# Patient Record
Sex: Female | Born: 1980
Health system: Southern US, Community
[De-identification: ages and names within clinical notes are randomized; demographics above are authoritative.]

## PROBLEM LIST (undated history)

## (undated) DIAGNOSIS — E538 Deficiency of other specified B group vitamins: Secondary | ICD-10-CM

## (undated) DIAGNOSIS — Z87442 Personal history of urinary calculi: Secondary | ICD-10-CM

## (undated) DIAGNOSIS — E559 Vitamin D deficiency, unspecified: Secondary | ICD-10-CM

## (undated) DIAGNOSIS — F419 Anxiety disorder, unspecified: Secondary | ICD-10-CM

## (undated) DIAGNOSIS — I10 Essential (primary) hypertension: Secondary | ICD-10-CM

## (undated) DIAGNOSIS — Z8619 Personal history of other infectious and parasitic diseases: Secondary | ICD-10-CM

## (undated) DIAGNOSIS — G43909 Migraine, unspecified, not intractable, without status migrainosus: Secondary | ICD-10-CM

## (undated) DIAGNOSIS — N200 Calculus of kidney: Secondary | ICD-10-CM

## (undated) HISTORY — DX: Vitamin D deficiency, unspecified: E55.9

## (undated) HISTORY — DX: Personal history of other infectious and parasitic diseases: Z86.19

## (undated) HISTORY — DX: Calculus of kidney: N20.0

## (undated) HISTORY — PX: WISDOM TOOTH EXTRACTION: SHX21

## (undated) HISTORY — DX: Anxiety disorder, unspecified: F41.9

## (undated) HISTORY — DX: Essential (primary) hypertension: I10

## (undated) HISTORY — DX: Deficiency of other specified B group vitamins: E53.8

## (undated) HISTORY — DX: Migraine, unspecified, not intractable, without status migrainosus: G43.909

---

## 2006-09-28 ENCOUNTER — Inpatient Hospital Stay (HOSPITAL_COMMUNITY): Admission: AD | Admit: 2006-09-28 | Discharge: 2006-10-01 | Payer: Self-pay | Admitting: Obstetrics and Gynecology

## 2008-01-02 ENCOUNTER — Encounter (INDEPENDENT_AMBULATORY_CARE_PROVIDER_SITE_OTHER): Payer: Self-pay | Admitting: *Deleted

## 2008-01-08 ENCOUNTER — Ambulatory Visit: Payer: Self-pay | Admitting: Internal Medicine

## 2008-01-08 DIAGNOSIS — I10 Essential (primary) hypertension: Secondary | ICD-10-CM | POA: Insufficient documentation

## 2008-01-16 ENCOUNTER — Encounter (INDEPENDENT_AMBULATORY_CARE_PROVIDER_SITE_OTHER): Payer: Self-pay | Admitting: *Deleted

## 2008-01-16 LAB — CONVERTED CEMR LAB
BUN: 14 mg/dL (ref 6–23)
Basophils Absolute: 0.2 10*3/uL — ABNORMAL HIGH (ref 0.0–0.1)
Basophils Relative: 3.5 % — ABNORMAL HIGH (ref 0.0–3.0)
Calcium: 9 mg/dL (ref 8.4–10.5)
Creatinine, Ser: 1 mg/dL (ref 0.4–1.2)
Eosinophils Absolute: 0.1 10*3/uL (ref 0.0–0.7)
Eosinophils Relative: 2 % (ref 0.0–5.0)
GFR calc non Af Amer: 71 mL/min
HCT: 40.9 % (ref 36.0–46.0)
Hemoglobin: 14.1 g/dL (ref 12.0–15.0)
MCHC: 34.4 g/dL (ref 30.0–36.0)
MCV: 90 fL (ref 78.0–100.0)
Monocytes Absolute: 0.4 10*3/uL (ref 0.1–1.0)
Neutro Abs: 3.5 10*3/uL (ref 1.4–7.7)
RBC: 4.54 M/uL (ref 3.87–5.11)
TSH: 1.98 microintl units/mL (ref 0.35–5.50)

## 2008-01-30 ENCOUNTER — Telehealth: Payer: Self-pay | Admitting: Internal Medicine

## 2008-10-14 ENCOUNTER — Observation Stay (HOSPITAL_COMMUNITY): Admission: AD | Admit: 2008-10-14 | Discharge: 2008-10-14 | Payer: Self-pay | Admitting: Obstetrics and Gynecology

## 2008-10-22 ENCOUNTER — Inpatient Hospital Stay (HOSPITAL_COMMUNITY): Admission: AD | Admit: 2008-10-22 | Discharge: 2008-10-24 | Payer: Self-pay | Admitting: Obstetrics & Gynecology

## 2009-03-11 ENCOUNTER — Ambulatory Visit: Payer: Self-pay | Admitting: Internal Medicine

## 2009-03-11 DIAGNOSIS — G43909 Migraine, unspecified, not intractable, without status migrainosus: Secondary | ICD-10-CM | POA: Insufficient documentation

## 2009-04-03 ENCOUNTER — Telehealth (INDEPENDENT_AMBULATORY_CARE_PROVIDER_SITE_OTHER): Payer: Self-pay | Admitting: *Deleted

## 2009-08-06 ENCOUNTER — Telehealth (INDEPENDENT_AMBULATORY_CARE_PROVIDER_SITE_OTHER): Payer: Self-pay | Admitting: *Deleted

## 2009-09-08 ENCOUNTER — Ambulatory Visit: Payer: Self-pay | Admitting: Internal Medicine

## 2009-09-08 DIAGNOSIS — F411 Generalized anxiety disorder: Secondary | ICD-10-CM | POA: Insufficient documentation

## 2009-10-13 ENCOUNTER — Ambulatory Visit: Payer: Self-pay | Admitting: Internal Medicine

## 2009-12-18 ENCOUNTER — Telehealth: Payer: Self-pay | Admitting: Internal Medicine

## 2010-04-07 ENCOUNTER — Ambulatory Visit
Admission: RE | Admit: 2010-04-07 | Discharge: 2010-04-07 | Payer: Self-pay | Source: Home / Self Care | Attending: Internal Medicine | Admitting: Internal Medicine

## 2010-05-05 NOTE — Assessment & Plan Note (Signed)
Summary: 6mos followup/alr   Vital Signs:  Patient profile:   30 year old female Height:      66 inches Weight:      120 pounds Temp:     98.4 degrees F oral Pulse rate:   72 / minute BP sitting:   100 / 78  (left arm)  Vitals Entered By: Jeremy Johann CMA (September 08, 2009 9:56 AM) CC: 6 month  Comments --BP check -- discuss anxiety issues REVIEWED MED LIST, PATIENT AGREED DOSE AND INSTRUCTION CORRECT    History of Present Illness: routine followup Doing well Ambulatory BP is around 120/80   Allergies (verified): No Known Drug Allergies  Past History:  Past Medical History: G2P2 Hypertension Dx as at age 55 kidney stones h/o migraines  Anxiety  Review of Systems       denies fatigue other  than not sleeping well due to her babies waking up  she's under a lot of stress, no anxiety per se but has noticed some diarrhea in the mornings, occasional nausea, no vomiting, no blood in the stools. Mild headaches different from her migraines. Thinks that all these symptoms are related to anxiety Her jaw is tight at night at some point she took Zoloft and wondersIf she should go back to it. She is still nursing  Physical Exam  General:  alert, well-developed, and well-nourished.   Lungs:  normal respiratory effort, no intercostal retractions, no accessory muscle use, and normal breath sounds.   Heart:  normal rate, regular rhythm, and no murmur.   Extremities:  no edema   Impression & Recommendations:  Problem # 1:  HYPERTENSION (ICD-401.9) well-controlled, no change Her updated medication list for this problem includes:    Labetalol Hcl 100 Mg Tabs (Labetalol hcl) .Marland Kitchen... 1 by mouth two times a day  Problem # 2:  HEADACHE (ICD-784.0) having mild HAs,  different from her regular migraine, patient thinks related to anxiety Will let me know if the HAs get  worse Her updated medication list for this problem includes:    Labetalol Hcl 100 Mg Tabs (Labetalol hcl) .Marland Kitchen... 1  by mouth two times a day  Problem # 3:  ANXIETY (ICD-300.00) see review of systems, patient having some anxiety lately. she would like to retry sertraline that helped in the past. I think that is reasonable, a prescription is issued. This is a safe drug during breast-feeding. Come back in 4 weeks If the mild headache some nausea continued, we'll have to workup those symptoms Her updated medication list for this problem includes:    Sertraline Hcl 50 Mg Tabs (Sertraline hcl) .Marland Kitchen... 1/2 a day x 1 week, then 1 a day  Complete Medication List: 1)  Labetalol Hcl 100 Mg Tabs (Labetalol hcl) .Marland Kitchen.. 1 by mouth two times a day 2)  Camila 0.35 Mg Tabs (Norethindrone) .... As directed 3)  Sertraline Hcl 50 Mg Tabs (Sertraline hcl) .... 1/2 a day x 1 week, then 1 a day  Patient Instructions: 1)  Please schedule a follow-up appointment in 4  weeks.  Prescriptions: LABETALOL HCL 100 MG TABS (LABETALOL HCL) 1 by mouth two times a day  #180 x 1   Entered and Authorized by:   Elita Quick E. Jourdin Gens MD   Signed by:   Nolon Rod. Rhett Mutschler MD on 09/08/2009   Method used:   Print then Give to Patient   RxID:   [id number] SERTRALINE HCL 50 MG TABS (SERTRALINE HCL) 1/2 a day x 1 week, then  1 a day  #30 x 3   Entered and Authorized by:   Nolon Rod. Rollyn Scialdone MD   Signed by:   Nolon Rod. Kaleb Linquist MD on 09/08/2009   Method used:   Print then Give to Patient   RxID:   [id number]

## 2010-05-05 NOTE — Progress Notes (Signed)
Summary: Jaw Clenching   Phone Note Call from Patient Call back at Home Phone [phone]   Caller: Patient Summary of Call: Patient called in and said that now that she has weened her daughter from nursing she was wanting to try and take something to calm her at night because of the jaw clenching. She would like to try this first before spending $500 on a mouth gaurd from the dentist. Please advise.    [facility] [place] [facility] [address] Initial call taken by: Harold Barban,  December 18, 2009 9:14 AM  Follow-up for Phone Call        we could try clonazepam 0.5 mg one or 2 p.o. q.h.s. call  #40, no refills. should help her relax at night. make her  aware that she cannot take this if she gets pregnant again Follow-up by: Jose E. Paz MD,  December 18, 2009 1:17 PM  Additional Follow-up for Phone Call Additional follow up Details #1::        Left messgae for pt to call back. Army Fossa CMA  December 18, 2009 1:26 PM     Additional Follow-up for Phone Call Additional follow up Details #2::    Discussed with pt she is aware of directions. Army Fossa CMA  December 18, 2009 1:43 PM   New/Updated Medications: CLONAZEPAM 0.5 MG TABS (CLONAZEPAM) 1 or 2 p.o. q.h.s. Prescriptions: CLONAZEPAM 0.5 MG TABS (CLONAZEPAM) 1 or 2 p.o. q.h.s.  #40 x 0   Entered by:   Army Fossa CMA   Authorized by:   Nolon Rod. Paz MD   Signed by:   Army Fossa CMA on 12/18/2009   Method used:   Printed then faxed to ...       [facility]  [address] [phone][facility] ([facility]ail[facility]       [address]       [place], [place]  [address]       Ph: [phone] or [phone]       Fax: [phone]   RxID:   [id number]

## 2010-05-05 NOTE — Progress Notes (Signed)
Summary: Refill Information  Phone Note Call from Patient   Caller: Patient Reason for Call: Refill Medication Details for Reason: Labatelol Summary of Call: Patient called this morning and said she needs this medicine, this is day 2 without it. I told her we had sent it in on 5.2.11 but it seems the pharmacy never got it. Initial call taken by: Harold Barban,  Aug 06, 2009 9:16 AM  Follow-up for Phone Call        Left msg for pt rx is ready for pickup, rx was resent this am and pharmacy has it .Kandice Hams  Aug 06, 2009 9:24 AM  Follow-up by: Kandice Hams,  Aug 06, 2009 9:24 AM

## 2010-05-05 NOTE — Assessment & Plan Note (Signed)
Summary: 4 week roa//lch   Vital Signs:  Patient profile:   30 year old female Weight:      118.25 pounds Pulse rate:   75 / minute Pulse rhythm:   regular BP sitting:   118 / 70  (left arm) Cuff size:   regular  Vitals Entered By: Army Fossa CMA (October 13, 2009 10:30 AM) CC: Pt here to f/u on Sertraline- working well.   History of Present Illness: she was seen last month with anxiety started sertraline Overall feels better and tolerating the medication well  Allergies (verified): No Known Drug Allergies  Past History:  Past Medical History: Reviewed history from 09/08/2009 and no changes required. G2P2 Hypertension Dx as at age 74 kidney stones h/o migraines  Anxiety  Past Surgical History: Reviewed history from 01/08/2008 and no changes required. no  Social History: Married 2 child  Never Smoked Alcohol use-no Drug use-no exercise-- sometimes, active   Review of Systems       previously she had nausea, some diarrhea and headache. All that is better. her jaw  was very tight and that has not improved significantly She is still breast-feeding She rarely takes ambulatory BPs but they are usually okay  Physical Exam  General:  alert, well-developed, and well-nourished.   Head:  no TMJ click Lungs:  normal respiratory effort, no intercostal retractions, no accessory muscle use, and normal breath sounds.   Heart:  normal rate, regular rhythm, and no murmur.   Psych:  Oriented X3, memory intact for recent and remote, normally interactive, good eye contact, not anxious appearing, and not depressed appearing.     Impression & Recommendations:  Problem # 1:  ANXIETY (ICD-300.00) seems to be improving Continue with same meds He still has "jaw tightness", symptoms are worse at night. Recommend to see her dentist: She may need a mouth guard Her updated medication list for this problem includes:    Sertraline Hcl 50 Mg Tabs (Sertraline hcl) .Marland Kitchen... 1 a  day  Problem # 2:  HYPERTENSION (ICD-401.9) well controlled. No change Her updated medication list for this problem includes:    Labetalol Hcl 100 Mg Tabs (Labetalol hcl) .Marland Kitchen... 1 by mouth two times a day  BP today: 118/70 Prior BP: 100/78 (09/08/2009)  Labs Reviewed: K+: 4.0 (01/08/2008) Creat: : 1.0 (01/08/2008)     Complete Medication List: 1)  Labetalol Hcl 100 Mg Tabs (Labetalol hcl) .Marland Kitchen.. 1 by mouth two times a day 2)  Camila 0.35 Mg Tabs (Norethindrone) .... As directed 3)  Sertraline Hcl 50 Mg Tabs (Sertraline hcl) .Marland Kitchen.. 1 a day  Patient Instructions: 1)  Please schedule a follow-up appointment in 6 months .  Prescriptions: SERTRALINE HCL 50 MG TABS (SERTRALINE HCL) 1 a day  #90 x 1   Entered and Authorized by:   Elita Quick E. Lakiesha Ralphs MD   Signed by:   Nolon Rod. Brooklin Rieger MD on 10/13/2009   Method used:   Print then Give to Patient   RxID:   [id number]

## 2010-05-07 NOTE — Assessment & Plan Note (Signed)
Summary: 6 MONTH FOLLOWUP/KN   Vital Signs:  Patient profile:   30 year old female Height:      66 inches Weight:      124.25 pounds BMI:     20.13 Pulse rate:   72 / minute Pulse rhythm:   regular BP sitting:   122 / 70  (left arm) Cuff size:   regular  Vitals Entered By: Army Fossa CMA (April 07, 2010 9:54 AM) CC: 6 month f/u- had coffee this am Comments c/o migraines- thinks it may be releated to [facility] [address].    History of Present Illness: ROV here w/ her 2 children c/o increased migraines compared to previous months HA are similar to previos migraines , usually unilateral, if severe they're associated with nausea and vomiting. Excedrin over-the-counter help  to some extent but has  caffeine which makes it harder to use at night time  ROS  anxiety well controlled w/ Sertraline although recognizes that the days that she feels more stressed, she has more headaches Ambulatory blood pressures usually within normal She switched her birth control pills, she medications list She is not nursing at this point. Her periods are back, she thinks that is actually increasing her headaches     Current Medications (verified): 1)  Labetalol Hcl 100 Mg Tabs (Labetalol Hcl) .Marland Kitchen.. 1 By Mouth Two Times A Day 2)  Camila 0.35 Mg Tabs (Norethindrone) .... As Directed 3)  Sertraline Hcl 50 Mg Tabs (Sertraline Hcl) .Marland Kitchen.. 1 A Day  Allergies (verified): No Known Drug Allergies  Past History:  Past Medical History: Reviewed history from 09/08/2009 and no changes required. G2P2 Hypertension Dx as at age 67 kidney stones h/o migraines  Anxiety  Past Surgical History: Reviewed history from 01/08/2008 and no changes required. no  Social History: Reviewed history from 10/13/2009 and no changes required. Married 2 child  Never Smoked Alcohol use-no Drug use-no exercise-- sometimes, active   Physical Exam  General:  alert, well-developed, and well-nourished.     Neurologic:  strength normal in all extremities and gait normal.   Psych:  normally interactive, good eye contact, and not depressed appearing.  slightly anxious, very  busy taking care of her 2 children   Impression & Recommendations:  Problem # 1:  HEADACHE (ICD-784.0) increase episodes of her headaches. The features of the headache is similar to previous migraines. Plan: Continue labetalol Increase sertraline dose as one of the triggers for her headaches his anxiety Continue over-the-counter Excedrin Trial of Maxalt, samples and a prescription provided  Her updated medication list for this problem includes:    Labetalol Hcl 100 Mg Tabs (Labetalol hcl) .Marland Kitchen... 1 by mouth two times a day    Maxalt 10 Mg Tabs (Rizatriptan benzoate) .Marland Kitchen... 1 by mouth with onset of headache, repeat x 1 two hours later if needed. no more than 2 a day  Problem # 2:  ANXIETY (ICD-300.00) well-controlled but d/t the increase in headaches will increase the dose of sertraline The following medications were removed from the medication list:    Clonazepam 0.5 Mg Tabs (Clonazepam) .Marland Kitchen... 1 or 2 p.o. q.h.s. Her updated medication list for this problem includes:    Sertraline Hcl 50 Mg Tabs (Sertraline hcl) .Marland Kitchen... 1.5 tabs a day  Problem # 3:  HYPERTENSION (ICD-401.9) at goal  Her updated medication list for this problem includes:    Labetalol Hcl 100 Mg Tabs (Labetalol hcl) .Marland Kitchen... 1 by mouth two times a day  Complete Medication List:  1)  Labetalol Hcl 100 Mg Tabs (Labetalol hcl) .Marland Kitchen.. 1 by mouth two times a day 2)  Camila 0.35 Mg Tabs (Norethindrone) .... As directed 3)  Sertraline Hcl 50 Mg Tabs (Sertraline hcl) .... 1.5 tabs a day 4)  Maxalt 10 Mg Tabs (Rizatriptan benzoate) .Marland Kitchen.. 1 by mouth with onset of headache, repeat x 1 two hours later if needed. no more than 2 a day  Patient Instructions: 1)  increase sertraline to 1.5 tabs a day 2)  as soon as you have A headache 3)  rest-fluids 4)  use maxalt 10mg  1  tab and may repeat in 2 hours, no more than 2 tabs a day 5)  you may continue taking excedrin instead of maxalt  6)  calll if no better  7)  Please schedule a follow-up appointment in 6 months .  Prescriptions: SERTRALINE HCL 50 MG TABS (SERTRALINE HCL) 1.5 tabs a day  #60 x 6   Entered and Authorized by:   Elita Quick E. Norton Bivins MD   Signed by:   Nolon Rod. Dessie Tatem MD on 04/07/2010   Method used:   Print then Give to Patient   RxID:   [id number] MAXALT 10 MG TABS (RIZATRIPTAN BENZOATE) 1 by mouth with onset of headache, repeat x 1 two hours later if needed. No more than 2 a day  #12 x 0   Entered and Authorized by:   Nolon Rod. Liam Cammarata MD   Signed by:   Nolon Rod. Chalee Hirota MD on 04/07/2010   Method used:   Print then Give to Patient   RxID:   [id number]    Orders Added: 1)  Est. Patient Level III [84696]

## 2010-07-12 LAB — COMPREHENSIVE METABOLIC PANEL WITH GFR
ALT: 17 U/L (ref 0–35)
ALT: 13 U/L (ref 0–35)
AST: 26 U/L (ref 0–37)
AST: 24 U/L (ref 0–37)
Albumin: 3.3 g/dL — ABNORMAL LOW (ref 3.5–5.2)
Albumin: 3.1 g/dL — ABNORMAL LOW (ref 3.5–5.2)
Alkaline Phosphatase: 129 U/L — ABNORMAL HIGH (ref 39–117)
Alkaline Phosphatase: 110 U/L (ref 39–117)
BUN: 6 mg/dL (ref 6–23)
BUN: 9 mg/dL (ref 6–23)
CO2: 22 meq/L (ref 19–32)
CO2: 21 meq/L (ref 19–32)
Calcium: 9.2 mg/dL (ref 8.4–10.5)
Calcium: 8.1 mg/dL — ABNORMAL LOW (ref 8.4–10.5)
Chloride: 105 meq/L (ref 96–112)
Chloride: 107 meq/L (ref 96–112)
Creatinine, Ser: 0.72 mg/dL (ref 0.4–1.2)
Creatinine, Ser: 0.82 mg/dL (ref 0.4–1.2)
GFR calc non Af Amer: >60 mL/min
GFR calc non Af Amer: >60 mL/min
Glucose, Bld: 71 mg/dL (ref 70–99)
Glucose, Bld: 104 mg/dL — ABNORMAL HIGH (ref 70–99)
Potassium: 3.9 meq/L (ref 3.5–5.1)
Potassium: 3.3 meq/L — ABNORMAL LOW (ref 3.5–5.1)
Sodium: 136 meq/L (ref 135–145)
Sodium: 136 meq/L (ref 135–145)
Total Bilirubin: 0.8 mg/dL (ref 0.3–1.2)
Total Bilirubin: 0.6 mg/dL (ref 0.3–1.2)
Total Protein: 6.5 g/dL (ref 6.0–8.3)
Total Protein: 6.5 g/dL (ref 6.0–8.3)

## 2010-07-12 LAB — CBC
HCT: 34.7 % — ABNORMAL LOW (ref 36.0–46.0)
HCT: 40.9 % (ref 36.0–46.0)
Hemoglobin: 11.8 g/dL — ABNORMAL LOW (ref 12.0–15.0)
Hemoglobin: 14.2 g/dL (ref 12.0–15.0)
MCHC: 34.1 g/dL (ref 30.0–36.0)
MCHC: 34.6 g/dL (ref 30.0–36.0)
MCV: 90.6 fL (ref 78.0–100.0)
MCV: 90.8 fL (ref 78.0–100.0)
MCV: 92.9 fL (ref 78.0–100.0)
Platelets: 169 10*3/uL (ref 150–400)
Platelets: 175 10*3/uL (ref 150–400)
Platelets: 157 10*3/uL (ref 150–400)
RBC: 3.83 MIL/uL — ABNORMAL LOW (ref 3.87–5.11)
RBC: 4.51 MIL/uL (ref 3.87–5.11)
RDW: 13 % (ref 11.5–15.5)
RDW: 13.9 % (ref 11.5–15.5)
WBC: 15.1 10*3/uL — ABNORMAL HIGH (ref 4.0–10.5)
WBC: 8.9 10*3/uL (ref 4.0–10.5)
WBC: 10 10*3/uL (ref 4.0–10.5)

## 2010-07-12 LAB — RPR: RPR Ser Ql: NONREACTIVE

## 2010-07-12 LAB — LACTATE DEHYDROGENASE
LDH: 135 U/L (ref 94–250)
LDH: 110 U/L (ref 94–250)

## 2010-07-12 LAB — URIC ACID: Uric Acid, Serum: 5.4 mg/dL (ref 2.4–7.0)

## 2010-07-13 ENCOUNTER — Other Ambulatory Visit: Payer: Self-pay | Admitting: Internal Medicine

## 2010-07-13 NOTE — Telephone Encounter (Signed)
Ok for 1-2

## 2010-07-13 NOTE — Telephone Encounter (Signed)
Paz pt- last ov 04/07/10, last filled 04/07/10 #12, no refills.

## 2010-09-26 ENCOUNTER — Other Ambulatory Visit: Payer: Self-pay | Admitting: Internal Medicine

## 2010-10-13 ENCOUNTER — Ambulatory Visit (INDEPENDENT_AMBULATORY_CARE_PROVIDER_SITE_OTHER): Payer: BC Managed Care – PPO | Admitting: Internal Medicine

## 2010-10-13 ENCOUNTER — Encounter: Payer: Self-pay | Admitting: Internal Medicine

## 2010-10-13 DIAGNOSIS — G43909 Migraine, unspecified, not intractable, without status migrainosus: Secondary | ICD-10-CM

## 2010-10-13 DIAGNOSIS — R51 Headache: Secondary | ICD-10-CM

## 2010-10-13 DIAGNOSIS — I10 Essential (primary) hypertension: Secondary | ICD-10-CM

## 2010-10-13 MED ORDER — LABETALOL HCL 100 MG PO TABS: 100.0000 mg | ORAL_TABLET | Freq: Two times a day (BID) | ORAL | Status: DC

## 2010-10-13 MED ORDER — RIZATRIPTAN BENZOATE 10 MG PO TABS: ORAL_TABLET | ORAL | Status: DC

## 2010-10-13 NOTE — Assessment & Plan Note (Signed)
RF  Maxalt

## 2010-10-13 NOTE — Patient Instructions (Signed)
Check the  blood pressure 2 or 3 times a week, be sure it is less than 130/85. If it is consistently higher, let me know

## 2010-10-13 NOTE — Progress Notes (Signed)
  Subjective:    Patient ID: Denise Roach, female    DOB: 13-Oct-1980, 30 y.o.   MRN: [id number]  HPI Routine office visit, feeling well. Birth control pills were discontinued, she had the feeling that they were increasing her headaches. That remains to be seen. Usually have one headache a month, associated with her periods, good response to Maxalt. Plans to have her third and last baby soon.  Past Medical History  Diagnosis Date  . Hypertension     dx as at age 45  . Kidney stone   . Migraines   . Anxiety    No past surgical history on file.   Review of Systems Good medication compliance with labetalol, ambulatory blood pressures within normal. Denies chest pain or lower extremity edema No nausea or vomiting.    Objective:   Physical Exam  Constitutional: She appears well-developed and well-nourished. No distress.  Cardiovascular: Normal rate, regular rhythm and normal heart sounds.   No murmur heard. Pulmonary/Chest: Effort normal and breath sounds normal. No respiratory distress. She has no wheezes. She has no rales.  Skin: She is not diaphoretic.          Assessment & Plan:

## 2010-10-13 NOTE — Assessment & Plan Note (Signed)
Well controlled, continue with present care. RF 1 year supply. Labs? States she is to have a checkup with her gynecologist, encouraged to get labs over there

## 2010-12-28 LAB — HEPATITIS B SURFACE ANTIGEN: Hepatitis B Surface Ag: NEGATIVE

## 2010-12-28 LAB — RPR: RPR: NONREACTIVE

## 2010-12-28 LAB — ABO/RH: RH Type: POSITIVE

## 2011-01-20 LAB — CBC
HCT: 33 — ABNORMAL LOW
HCT: 38.2
Hemoglobin: 10.9 — ABNORMAL LOW
MCHC: 33
MCV: 90.4
MCV: 92
RBC: 3.58 — ABNORMAL LOW
RBC: 4.23
WBC: 12 — ABNORMAL HIGH

## 2011-04-06 NOTE — L&D Delivery Note (Signed)
Delivery Note At 2:57 PM a viable female was delivered via Vaginal, Spontaneous Delivery (Presentation: vtx roa;  ).  APGAR:8/8 , ; weight .   Placenta status:intact with 3 vessel cord , .  Cord:  with the following complications: none.  Cord pH: none  Anesthesia:  epidural Episiotomy: none Lacerations: none Suture Repair: na Est. Blood Loss (mL): 300 cc  Mom to postpartum.  Baby to nursery-stable.  Modupe Shampine S 07/22/2011, 3:03 PM

## 2011-07-14 ENCOUNTER — Encounter (HOSPITAL_COMMUNITY): Payer: Self-pay | Admitting: *Deleted

## 2011-07-14 ENCOUNTER — Telehealth (HOSPITAL_COMMUNITY): Payer: Self-pay | Admitting: *Deleted

## 2011-07-14 NOTE — Telephone Encounter (Signed)
Preadmission screen  

## 2011-07-16 ENCOUNTER — Encounter (HOSPITAL_COMMUNITY): Payer: Self-pay | Admitting: *Deleted

## 2011-07-16 ENCOUNTER — Telehealth (HOSPITAL_COMMUNITY): Payer: Self-pay | Admitting: *Deleted

## 2011-07-16 NOTE — Telephone Encounter (Signed)
Preadmission screen  

## 2011-07-22 ENCOUNTER — Inpatient Hospital Stay (HOSPITAL_COMMUNITY)
Admission: RE | Admit: 2011-07-22 | Discharge: 2011-07-23 | DRG: 372 | Disposition: A | Discharge status: Home / Self Care | Payer: BC Managed Care – PPO | Source: Ambulatory Visit | Attending: Obstetrics and Gynecology | Admitting: Obstetrics and Gynecology

## 2011-07-22 ENCOUNTER — Encounter (HOSPITAL_COMMUNITY): Payer: Self-pay | Admitting: Anesthesiology

## 2011-07-22 ENCOUNTER — Inpatient Hospital Stay (HOSPITAL_COMMUNITY): Payer: BC Managed Care – PPO | Admitting: Anesthesiology

## 2011-07-22 ENCOUNTER — Encounter (HOSPITAL_COMMUNITY): Payer: Self-pay

## 2011-07-22 DIAGNOSIS — O1002 Pre-existing essential hypertension complicating childbirth: Principal | ICD-10-CM | POA: Diagnosis present

## 2011-07-22 LAB — CBC
HCT: 36.4 % (ref 36.0–46.0)
MCH: 29.5 pg (ref 26.0–34.0)
MCHC: 33 g/dL (ref 30.0–36.0)
Platelets: 196 10*3/uL (ref 150–400)
Platelets: 188 10*3/uL (ref 150–400)
RBC: 4.37 MIL/uL (ref 3.87–5.11)
RDW: 13.9 % (ref 11.5–15.5)
RDW: 13.8 % (ref 11.5–15.5)
WBC: 9.8 10*3/uL (ref 4.0–10.5)
WBC: 14.1 10*3/uL — ABNORMAL HIGH (ref 4.0–10.5)

## 2011-07-22 LAB — RPR: RPR Ser Ql: NONREACTIVE

## 2011-07-22 MED ORDER — LABETALOL HCL 100 MG PO TABS
100.0000 mg | ORAL_TABLET | Freq: Two times a day (BID) | ORAL | Status: DC
  Administered 2011-07-22 – 2011-07-23 (×2): 100 mg via ORAL
  Filled 2011-07-22 (×4): qty 1

## 2011-07-22 MED ORDER — SENNOSIDES-DOCUSATE SODIUM 8.6-50 MG PO TABS
2.0000 | ORAL_TABLET | Freq: Every day | ORAL | Status: DC
  Administered 2011-07-22: 2 via ORAL

## 2011-07-22 MED ORDER — TERBUTALINE SULFATE 1 MG/ML IJ SOLN: 0.2500 mg | Freq: Once | INTRAMUSCULAR | Status: DC | PRN

## 2011-07-22 MED ORDER — FENTANYL 2.5 MCG/ML BUPIVACAINE 1/10 % EPIDURAL INFUSION (WH - ANES)
14.0000 mL/h | INTRAMUSCULAR | Status: DC
  Administered 2011-07-22 (×2): 14 mL/h via EPIDURAL
  Filled 2011-07-22 (×2): qty 60

## 2011-07-22 MED ORDER — FLEET ENEMA 7-19 GM/118ML RE ENEM: 1.0000 | ENEMA | RECTAL | Status: DC | PRN

## 2011-07-22 MED ORDER — PHENYLEPHRINE 40 MCG/ML (10ML) SYRINGE FOR IV PUSH (FOR BLOOD PRESSURE SUPPORT): 80.0000 ug | PREFILLED_SYRINGE | INTRAVENOUS | Status: DC | PRN

## 2011-07-22 MED ORDER — OXYCODONE-ACETAMINOPHEN 5-325 MG PO TABS: 1.0000 | ORAL_TABLET | ORAL | Status: DC | PRN

## 2011-07-22 MED ORDER — ACETAMINOPHEN 325 MG PO TABS: 650.0000 mg | ORAL_TABLET | ORAL | Status: DC | PRN

## 2011-07-22 MED ORDER — ERYTHROMYCIN 5 MG/GM OP OINT
TOPICAL_OINTMENT | OPHTHALMIC | Status: AC
  Filled 2011-07-22: qty 1

## 2011-07-22 MED ORDER — IBUPROFEN 600 MG PO TABS: 600.0000 mg | ORAL_TABLET | Freq: Four times a day (QID) | ORAL | Status: DC | PRN

## 2011-07-22 MED ORDER — LACTATED RINGERS IV SOLN: 500.0000 mL | INTRAVENOUS | Status: DC | PRN

## 2011-07-22 MED ORDER — LIDOCAINE HCL (PF) 1 % IJ SOLN
30.0000 mL | INTRAMUSCULAR | Status: DC | PRN
  Filled 2011-07-22: qty 30

## 2011-07-22 MED ORDER — PRENATAL MULTIVITAMIN CH
1.0000 | ORAL_TABLET | Freq: Every day | ORAL | Status: DC
  Administered 2011-07-23: 1 via ORAL
  Filled 2011-07-22: qty 1

## 2011-07-22 MED ORDER — DIPHENHYDRAMINE HCL 50 MG/ML IJ SOLN: 12.5000 mg | INTRAMUSCULAR | Status: DC | PRN

## 2011-07-22 MED ORDER — WITCH HAZEL-GLYCERIN EX PADS: 1.0000 "application " | MEDICATED_PAD | CUTANEOUS | Status: DC | PRN

## 2011-07-22 MED ORDER — LACTATED RINGERS IV SOLN
INTRAVENOUS | Status: DC
  Administered 2011-07-22 (×2): via INTRAVENOUS

## 2011-07-22 MED ORDER — CITRIC ACID-SODIUM CITRATE 334-500 MG/5ML PO SOLN: 30.0000 mL | ORAL | Status: DC | PRN

## 2011-07-22 MED ORDER — LANOLIN HYDROUS EX OINT: TOPICAL_OINTMENT | CUTANEOUS | Status: DC | PRN

## 2011-07-22 MED ORDER — BENZOCAINE-MENTHOL 20-0.5 % EX AERO: 1.0000 "application " | INHALATION_SPRAY | CUTANEOUS | Status: DC | PRN

## 2011-07-22 MED ORDER — OXYTOCIN 20 UNITS IN LACTATED RINGERS INFUSION - SIMPLE
1.0000 m[IU]/min | INTRAVENOUS | Status: DC
  Administered 2011-07-22: 2 m[IU]/min via INTRAVENOUS
  Filled 2011-07-22: qty 1000

## 2011-07-22 MED ORDER — ONDANSETRON HCL 4 MG PO TABS: 4.0000 mg | ORAL_TABLET | ORAL | Status: DC | PRN

## 2011-07-22 MED ORDER — DIPHENHYDRAMINE HCL 25 MG PO CAPS: 25.0000 mg | ORAL_CAPSULE | Freq: Four times a day (QID) | ORAL | Status: DC | PRN

## 2011-07-22 MED ORDER — OXYTOCIN BOLUS FROM INFUSION
500.0000 mL | Freq: Once | INTRAVENOUS | Status: DC
  Filled 2011-07-22: qty 500

## 2011-07-22 MED ORDER — ONDANSETRON HCL 4 MG/2ML IJ SOLN
4.0000 mg | Freq: Four times a day (QID) | INTRAMUSCULAR | Status: DC | PRN
  Administered 2011-07-22: 4 mg via INTRAVENOUS
  Filled 2011-07-22: qty 2

## 2011-07-22 MED ORDER — IBUPROFEN 600 MG PO TABS
600.0000 mg | ORAL_TABLET | Freq: Four times a day (QID) | ORAL | Status: DC
  Administered 2011-07-22 – 2011-07-23 (×3): 600 mg via ORAL
  Filled 2011-07-22 (×3): qty 1

## 2011-07-22 MED ORDER — EPHEDRINE 5 MG/ML INJ
10.0000 mg | INTRAVENOUS | Status: DC | PRN
  Filled 2011-07-22: qty 4

## 2011-07-22 MED ORDER — OXYTOCIN 20 UNITS IN LACTATED RINGERS INFUSION - SIMPLE
125.0000 mL/h | Freq: Once | INTRAVENOUS | Status: AC
  Administered 2011-07-22: 125 mL/h via INTRAVENOUS

## 2011-07-22 MED ORDER — DIBUCAINE 1 % RE OINT: 1.0000 "application " | TOPICAL_OINTMENT | RECTAL | Status: DC | PRN

## 2011-07-22 MED ORDER — SIMETHICONE 80 MG PO CHEW: 80.0000 mg | CHEWABLE_TABLET | ORAL | Status: DC | PRN

## 2011-07-22 MED ORDER — TETANUS-DIPHTH-ACELL PERTUSSIS 5-2.5-18.5 LF-MCG/0.5 IM SUSP: 0.5000 mL | Freq: Once | INTRAMUSCULAR | Status: DC

## 2011-07-22 MED ORDER — FLEET ENEMA 7-19 GM/118ML RE ENEM: 1.0000 | ENEMA | Freq: Every day | RECTAL | Status: DC | PRN

## 2011-07-22 MED ORDER — LIDOCAINE HCL (PF) 1 % IJ SOLN
INTRAMUSCULAR | Status: DC | PRN
  Administered 2011-07-22 (×3): 4 mL

## 2011-07-22 MED ORDER — LACTATED RINGERS IV SOLN
500.0000 mL | Freq: Once | INTRAVENOUS | Status: AC
  Administered 2011-07-22: 500 mL via INTRAVENOUS

## 2011-07-22 MED ORDER — ONDANSETRON HCL 4 MG/2ML IJ SOLN: 4.0000 mg | INTRAMUSCULAR | Status: DC | PRN

## 2011-07-22 MED ORDER — EPHEDRINE 5 MG/ML INJ: 10.0000 mg | INTRAVENOUS | Status: DC | PRN

## 2011-07-22 MED ORDER — PHENYLEPHRINE 40 MCG/ML (10ML) SYRINGE FOR IV PUSH (FOR BLOOD PRESSURE SUPPORT)
80.0000 ug | PREFILLED_SYRINGE | INTRAVENOUS | Status: DC | PRN
  Filled 2011-07-22: qty 5

## 2011-07-22 MED ORDER — ZOLPIDEM TARTRATE 5 MG PO TABS: 5.0000 mg | ORAL_TABLET | Freq: Every evening | ORAL | Status: DC | PRN

## 2011-07-22 MED ORDER — BISACODYL 10 MG RE SUPP: 10.0000 mg | Freq: Every day | RECTAL | Status: DC | PRN

## 2011-07-22 NOTE — H&P (Signed)
Denise Roach is a 31 y.o. female presenting for for IOL.  PNC complicated by chronic hypertension.  Has been on labatalol.  Despiote increasing doses bp have been elevated.  150/100 and 140/100.  EFW  11 % tile.  No overt pih sx's.  Very favorable cx. Now for induction due to pregnancy compliated by woorsening hypertension beyond 37 weeks.  Neg GBS. Maternal Medical History:  Prenatal Complications - Diabetes: none.    OB History    Grav Para Term Preterm Abortions TAB SAB Ect Mult Living   3 2 2       2      Past Medical History  Diagnosis Date  . Hypertension     dx as at age 12  . Kidney stone   . Migraines   . Anxiety   . History of chicken pox    Past Surgical History  Procedure Date  . No past surgeries   . Wisdom tooth extraction    Family History: family history includes Diabetes in her maternal grandfather; Heart attack in her paternal grandmother; Heart disease in her maternal grandfather; Hypertension in her mother; and Stroke in her maternal grandmother and paternal grandmother.  There is no history of Colon cancer and Breast cancer. Social History:  reports that she has never smoked. She has never used smokeless tobacco. She reports that she does not drink alcohol or use illicit drugs.  ROS    Blood pressure 137/94, pulse 79, temperature 98 F (36.7 C), temperature source Oral, height 5\' 6"  (1.676 m), weight 70.308 kg (155 lb), last menstrual period 11/01/2010. Maternal Exam:  Uterine Assessment: Contraction strength is mild.  Contraction frequency is irregular.   Abdomen: Patient reports no abdominal tenderness. Fundal height is c/w dates.   Estimated fetal weight is 6.   Fetal presentation: vertex  Pelvis: adequate for delivery.   Cervix: Cervix evaluated by digital exam.     Physical Exam  Respiratory: Effort normal and breath sounds normal.  GI: Soft. Bowel sounds are normal.  Musculoskeletal: She exhibits edema.  Neurological: She has normal  reflexes.    Prenatal labs: ABO, Rh: O/Positive/-- (09/24 0000) Antibody: Negative (09/24 0000) Rubella: Immune (09/24 0000) RPR: Nonreactive (09/24 0000)  HBsAg: Negative (09/24 0000)  HIV: Non-reactive (09/24 0000)  GBS: Negative (04/02 0000)   Assessment/Plan: IUP at 38.2 with chronic hypertension with woorsening bp's.  IOL risks discussed    Cincere Deprey S 07/22/2011, 9:26 AM

## 2011-07-22 NOTE — Anesthesia Procedure Notes (Signed)
Epidural Patient location during procedure: OB Start time: 07/22/2011 10:01 AM Reason for block: procedure for pain  Staffing Performed by: anesthesiologist   Preanesthetic Checklist Completed: patient identified, site marked, surgical consent, pre-op evaluation, timeout performed, IV checked, risks and benefits discussed and monitors and equipment checked  Epidural Patient position: sitting Prep: site prepped and draped and DuraPrep Patient monitoring: continuous pulse ox and blood pressure Approach: midline Injection technique: LOR air  Needle:  Needle type: Tuohy  Needle gauge: 17 G Needle length: 9 cm Needle insertion depth: 5 cm cm Catheter type: closed end flexible Catheter size: 19 Gauge Catheter at skin depth: 10 cm Test dose: negative  Assessment Events: blood not aspirated, injection not painful, no injection resistance, negative IV test and no paresthesia  Additional Notes Discussed risk of headache, infection, bleeding, nerve injury and failed or incomplete block.  Patient voices understanding and wishes to proceed.

## 2011-07-22 NOTE — Anesthesia Preprocedure Evaluation (Signed)
Anesthesia Evaluation  Patient identified by MRN, date of birth, ID band Patient awake    Reviewed: Allergy & Precautions, H&P , NPO status , Patient's Chart, lab work & pertinent test results, reviewed documented beta blocker date and time   History of Anesthesia Complications Negative for: history of anesthetic complications  Airway Mallampati: III TM Distance: >3 FB Neck ROM: full    Dental  (+) Teeth Intact   Pulmonary  Cough - relates to seasonal allergies breath sounds clear to auscultation        Cardiovascular hypertension (chronic HTN), On Home Beta Blockers Rhythm:regular Rate:Normal     Neuro/Psych  Headaches (none in third trimester), negative psych ROS   GI/Hepatic Neg liver ROS, GERD-  Medicated,  Endo/Other  negative endocrine ROS  Renal/GU H/o kidney stone  negative genitourinary   Musculoskeletal   Abdominal   Peds  Hematology negative hematology ROS (+)   Anesthesia Other Findings   Reproductive/Obstetrics (+) Pregnancy                           Anesthesia Physical Anesthesia Plan  ASA: II  Anesthesia Plan: Epidural   Post-op Pain Management:    Induction:   Airway Management Planned:   Additional Equipment:   Intra-op Plan:   Post-operative Plan:   Informed Consent: I have reviewed the patients History and Physical, chart, labs and discussed the procedure including the risks, benefits and alternatives for the proposed anesthesia with the patient or authorized representative who has indicated his/her understanding and acceptance.     Plan Discussed with:   Anesthesia Plan Comments:         Anesthesia Quick Evaluation

## 2011-07-23 LAB — CBC
HCT: 32.5 % — ABNORMAL LOW (ref 36.0–46.0)
Hemoglobin: 10.7 g/dL — ABNORMAL LOW (ref 12.0–15.0)
MCHC: 32.9 g/dL (ref 30.0–36.0)
RDW: 13.8 % (ref 11.5–15.5)
WBC: 10.9 10*3/uL — ABNORMAL HIGH (ref 4.0–10.5)

## 2011-07-23 MED ORDER — IBUPROFEN 600 MG PO TABS: 600.0000 mg | ORAL_TABLET | Freq: Four times a day (QID) | ORAL | Status: AC

## 2011-07-23 MED ORDER — OXYCODONE-ACETAMINOPHEN 5-325 MG PO TABS: 1.0000 | ORAL_TABLET | ORAL | Status: AC | PRN

## 2011-07-23 NOTE — Discharge Summary (Signed)
Obstetric Discharge Summary Reason for Admission: induction of labor Prenatal Procedures: ultrasound Intrapartum Procedures: spontaneous vaginal delivery Postpartum Procedures: none Complications-Operative and Postpartum: none Hemoglobin  Date Value Range Status  07/23/2011 10.7* 12.0-15.0 (g/dL) Final     DELTA CHECK NOTED     REPEATED TO VERIFY     HCT  Date Value Range Status  07/23/2011 32.5* 36.0-46.0 (%) Final    Physical Exam:  General: alert Lochia: appropriate Uterine Fundus: firm Incision: N/A DVT Evaluation: No evidence of DVT seen on physical exam.  Discharge Diagnoses: Term Pregnancy-delivered  Discharge Information: Date: 07/23/2011 Activity: pelvic rest Diet: routine Medications: PNV, Percocet and labetolol Condition: stable Instructions: refer to practice specific booklet Discharge to: home Follow-up Information    Call in 1 week to follow up. (blood pressure check)          Newborn Data: Live born female  Birth Weight: 6 lb 4 oz (2835 g) APGAR: 8, 9  Home with mother.  Denise Roach II,Jermond Burkemper E 07/23/2011, 9:49 AM

## 2011-07-23 NOTE — Anesthesia Postprocedure Evaluation (Signed)
  Anesthesia Post-op Note  Patient: Denise Roach  Procedure(s) Performed: * No procedures listed *  Patient Location: Mother/Baby  Anesthesia Type: Epidural  Level of Consciousness: awake, alert  and oriented  Airway and Oxygen Therapy: Patient Spontanous Breathing  Post-op Pain: none  Post-op Assessment: Post-op Vital signs reviewed, Patient's Cardiovascular Status Stable, No headache, No backache, No residual numbness and No residual motor weakness  Post-op Vital Signs: Reviewed and stable  Complications: No apparent anesthesia complications

## 2011-07-23 NOTE — Progress Notes (Signed)
PPD #1 Wants to go home, voiding, ambulating, good pain relief, decreasing lochia  Blood pressure 108/69, pulse 75, temperature 97.8 F (36.6 C), temperature source Oral, resp. rate 18, height 5\' 6"  (1.676 m), weight 70.308 kg (155 lb), last menstrual period 11/01/2010, SpO2 99.00%, unknown if currently breastfeeding.  FFNT  A: satisfactory  P: D/C home after 4 pm     Instructions given

## 2011-07-27 ENCOUNTER — Inpatient Hospital Stay (HOSPITAL_COMMUNITY): Admission: RE | Admit: 2011-07-27 | Payer: BC Managed Care – PPO | Source: Ambulatory Visit

## 2011-08-03 ENCOUNTER — Inpatient Hospital Stay (HOSPITAL_COMMUNITY): Admission: AD | Admit: 2011-08-03 | Payer: Self-pay | Source: Ambulatory Visit | Admitting: Obstetrics and Gynecology

## 2011-10-15 ENCOUNTER — Encounter: Payer: Self-pay | Admitting: Internal Medicine

## 2011-10-15 ENCOUNTER — Ambulatory Visit (INDEPENDENT_AMBULATORY_CARE_PROVIDER_SITE_OTHER): Payer: BC Managed Care – PPO | Admitting: Internal Medicine

## 2011-10-15 VITALS — BP 122/84 | HR 80 | Temp 98.2°F

## 2011-10-15 DIAGNOSIS — R51 Headache: Secondary | ICD-10-CM

## 2011-10-15 DIAGNOSIS — Z0000 Encounter for general adult medical examination without abnormal findings: Secondary | ICD-10-CM

## 2011-10-15 DIAGNOSIS — I10 Essential (primary) hypertension: Secondary | ICD-10-CM

## 2011-10-15 DIAGNOSIS — F411 Generalized anxiety disorder: Secondary | ICD-10-CM

## 2011-10-15 LAB — COMPREHENSIVE METABOLIC PANEL
Albumin: 4.5 g/dL (ref 3.5–5.2)
BUN: 15 mg/dL (ref 6–23)
CO2: 27 mEq/L (ref 19–32)
Calcium: 9.6 mg/dL (ref 8.4–10.5)
Chloride: 105 mEq/L (ref 96–112)
Creatinine, Ser: 1 mg/dL (ref 0.4–1.2)
GFR: 65.56 mL/min (ref >60.00)
Glucose, Bld: 91 mg/dL (ref 70–99)
Potassium: 4.1 mEq/L (ref 3.5–5.1)

## 2011-10-15 LAB — CBC WITH DIFFERENTIAL/PLATELET
Basophils Relative: 0.9 % (ref 0.0–3.0)
Eosinophils Absolute: 0.1 10*3/uL (ref 0.0–0.7)
Eosinophils Relative: 2.8 % (ref 0.0–5.0)
Hemoglobin: 12.8 g/dL (ref 12.0–15.0)
Lymphocytes Relative: 37.7 % (ref 12.0–46.0)
MCHC: 32.8 g/dL (ref 30.0–36.0)
Neutro Abs: 2.2 10*3/uL (ref 1.4–7.7)
Neutrophils Relative %: 48.9 % (ref 43.0–77.0)
RBC: 4.38 Mil/uL (ref 3.87–5.11)
WBC: 4.4 10*3/uL — ABNORMAL LOW (ref 4.5–10.5)

## 2011-10-15 LAB — CHOLESTEROL, TOTAL: Cholesterol: 253 mg/dL — ABNORMAL HIGH (ref 0–200)

## 2011-10-15 LAB — TSH: TSH: 1.2 u[IU]/mL (ref 0.35–5.50)

## 2011-10-15 NOTE — Assessment & Plan Note (Addendum)
Reports she had a Tdap recently She is 2.5 months postpartum, recommend to gradually go back to a healthy diet and exercise Labs

## 2011-10-15 NOTE — Progress Notes (Signed)
  Subjective:    Patient ID: Denise Roach, female    DOB: 17-Nov-1980, 31 y.o.   MRN: [id number]  HPI CPX  Past Medical History: Hypertension Dx as at age 63 kidney stones h/o migraines  Anxiety  Past Surgical History: no  Social History: Married 3 children, last born ~ 08-2011 Never Smoked Alcohol use- socially Drug use-no  Family History: stroke - PGM, MGM High chol--M CAD - MGF. PGM (MI age 80) HTN -  + family hx late onset DM - MGF (late onset) colon Ca - no breast Ca - no       Review of Systems Patient is breast-feeding. No chest pain or shortness of breath No nausea, vomiting, diarrhea. No anxiety- depression Had some headaches few weeks ago, see assessment and plan. Good compliance with BP medications, ambulatory BPs normal.    Objective:   Physical Exam General -- alert, well-developed, here w/ her baby.   Neck --no thyromegaly Lungs -- normal respiratory effort, no intercostal retractions, no accessory muscle use, and normal breath sounds.   Heart-- normal rate, regular rhythm, no murmur, and no gallop.   Extremities-- no pretibial edema bilaterally Neurologic-- alert & oriented X3 and strength normal in all extremities. Psych-- Cognition and judgment appear intact. Alert and cooperative with normal attention span and concentration.  not anxious appearing and not depressed appearing.      Assessment & Plan:

## 2011-10-15 NOTE — Assessment & Plan Note (Addendum)
BP was slightly elevated at the end of for recent pregnancy however since then it has been fine. No change, Continue with labetalol. She is breast-feeding, reports her baby is doing well.

## 2011-10-15 NOTE — Patient Instructions (Addendum)
Next visit in one year as long as your BP is good and your labs are normal. Call anytime when you need a refill ------ Check the  blood pressure 2 or 3 times a week, be sure it is between 110/60 and 140/85. If it is consistently higher or lower, let me know

## 2011-10-15 NOTE — Assessment & Plan Note (Signed)
Had several episodes of headaches 3 weeks after her delivery, now asymptomatic.

## 2011-10-15 NOTE — Assessment & Plan Note (Signed)
Not an issue at the present time. No postpartum depression

## 2011-10-19 ENCOUNTER — Encounter: Payer: Self-pay | Admitting: Internal Medicine

## 2011-12-18 ENCOUNTER — Other Ambulatory Visit: Payer: Self-pay | Admitting: Internal Medicine

## 2011-12-20 NOTE — Telephone Encounter (Signed)
Refill done.  

## 2012-09-12 ENCOUNTER — Ambulatory Visit (INDEPENDENT_AMBULATORY_CARE_PROVIDER_SITE_OTHER): Payer: BC Managed Care – PPO | Admitting: Internal Medicine

## 2012-09-12 VITALS — BP 108/80 | HR 66 | Temp 98.6°F | Wt 121.0 lb

## 2012-09-12 DIAGNOSIS — T148 Other injury of unspecified body region: Secondary | ICD-10-CM

## 2012-09-12 DIAGNOSIS — W57XXXA Bitten or stung by nonvenomous insect and other nonvenomous arthropods, initial encounter: Secondary | ICD-10-CM

## 2012-09-12 DIAGNOSIS — R51 Headache: Secondary | ICD-10-CM

## 2012-09-12 MED ORDER — DOXYCYCLINE HYCLATE 100 MG PO TABS: 100.0000 mg | ORAL_TABLET | Freq: Two times a day (BID) | ORAL | Status: DC

## 2012-09-12 NOTE — Patient Instructions (Addendum)
Call if you develop any problems with the antibiotic (diarrhea) , avoid excessive sun exposure. Call if fever, headaches, more rash, joint aches

## 2012-09-12 NOTE — Progress Notes (Signed)
  Subjective:    Patient ID: Denise Roach, female    DOB: [birth year], 32 y.o.   MRN: [id number]  HPI Acute visit 3 weeks ago she pulled a tick from her chest, it was somehow engorged. A week later she developed a rash around the area and the rash has increased some . Has 2 other rash elsewhere, see physical exam Also reports she still has migraines, on average twice a month, usually 1 or 2 APAP bubalbitol tablets take care of the pain, pain is not debilitating. Wonders if she needs a daily preventive medication.  Past Medical History  Diagnosis Date  . Hypertension     dx as at age 32  . Kidney stone   . Migraines   . Anxiety   . History of chicken pox     Past Surgical History  Procedure Laterality Date  . No past surgeries    . Wisdom tooth extraction     History   Social History  . Marital Status: Married    Spouse Name: N/A    Number of Children: 3  . Years of Education: N/A   Occupational History  . ADMIN ASSISTANT    Social History Main Topics  . Smoking status: Never Smoker   . Smokeless tobacco: Never Used  . Alcohol Use: No  . Drug Use: No  . Sexually Active: Yes   Other Topics Concern  . Not on file   Social History Narrative        Review of Systems Denies fever or chills No joint pains No decrease of energy Headaches are baseline    Objective:   Physical Exam  Skin:     Reports 2 other "rash", she has 1 excoriation around each hip, not really a rash.    General -- alert, well-developed, nad .     Neurologic-- alert & oriented X3 and strength normal in all extremities. Psych-- Cognition and judgment appear intact. Alert and cooperative with normal attention span and concentration.  not anxious appearing and not depressed appearing.      Assessment & Plan:   Tick bite Tick bite w/ a local rash, no systemic symptoms. Patient is concerned about Lyme disease. Pros and cons of treatment versus observation discussed with the  patient. She is concerned, we'll proceed with doxycycline for 10 days, avoid  excessive sun exposure. She is not breast-feeding anymore, and has an IUD.

## 2012-09-12 NOTE — Assessment & Plan Note (Addendum)
Headaches at baseline, on average 2 episodes a month that respond quickly to abortive medication. I don't think at this point she needs a daily preventive medication.

## 2012-09-13 ENCOUNTER — Encounter: Payer: Self-pay | Admitting: Internal Medicine

## 2012-10-16 ENCOUNTER — Encounter: Payer: Self-pay | Admitting: Internal Medicine

## 2012-10-16 ENCOUNTER — Ambulatory Visit (INDEPENDENT_AMBULATORY_CARE_PROVIDER_SITE_OTHER): Payer: BC Managed Care – PPO | Admitting: Internal Medicine

## 2012-10-16 VITALS — BP 112/80 | HR 74 | Temp 98.3°F | Ht 66.5 in | Wt 124.4 lb

## 2012-10-16 DIAGNOSIS — I10 Essential (primary) hypertension: Secondary | ICD-10-CM

## 2012-10-16 DIAGNOSIS — R51 Headache: Secondary | ICD-10-CM

## 2012-10-16 DIAGNOSIS — Z0000 Encounter for general adult medical examination without abnormal findings: Secondary | ICD-10-CM

## 2012-10-16 LAB — CBC WITH DIFFERENTIAL/PLATELET
Basophils Absolute: 0 10*3/uL (ref 0.0–0.1)
HCT: 38.3 % (ref 36.0–46.0)
Hemoglobin: 12.8 g/dL (ref 12.0–15.0)
Lymphs Abs: 1.6 10*3/uL (ref 0.7–4.0)
MCV: 91 fl (ref 78.0–100.0)
Monocytes Absolute: 0.4 10*3/uL (ref 0.1–1.0)
Monocytes Relative: 8 % (ref 3.0–12.0)
Neutro Abs: 2.3 10*3/uL (ref 1.4–7.7)
RDW: 14 % (ref 11.5–14.6)

## 2012-10-16 LAB — COMPREHENSIVE METABOLIC PANEL
ALT: 13 U/L (ref 0–35)
AST: 19 U/L (ref 0–37)
Alkaline Phosphatase: 38 U/L — ABNORMAL LOW (ref 39–117)
CO2: 27 mEq/L (ref 19–32)
Creatinine, Ser: 0.9 mg/dL (ref 0.4–1.2)
GFR: 79 mL/min (ref >60.00)
Sodium: 137 mEq/L (ref 135–145)
Total Bilirubin: 1.1 mg/dL (ref 0.3–1.2)
Total Protein: 7.4 g/dL (ref 6.0–8.3)

## 2012-10-16 LAB — LDL CHOLESTEROL, DIRECT: Direct LDL: 115 mg/dL

## 2012-10-16 LAB — LIPID PANEL
Total CHOL/HDL Ratio: 2
VLDL: 12.6 mg/dL (ref 0.0–40.0)

## 2012-10-16 MED ORDER — LABETALOL HCL 200 MG PO TABS: 200.0000 mg | ORAL_TABLET | Freq: Two times a day (BID) | ORAL | Status: DC

## 2012-10-16 NOTE — Assessment & Plan Note (Signed)
Recently, Gynecology recommend to increase labetalol dose for even  better control of headaches, she seems to be doing slightly better without side effects. Plan: Continue with higher labetalol dose and prn abortive meds

## 2012-10-16 NOTE — Assessment & Plan Note (Addendum)
Tdap 07-2011 Gyn per Dr [facility] (patient is specifically concerned about her cholesterol, her younger brother takes statins). Cont w/ healthy lifestyle

## 2012-10-16 NOTE — Progress Notes (Signed)
  Subjective:    Patient ID: Denise Roach, female    DOB: April 15, 1980, 32 y.o.   MRN: [id number]  HPI Complete physical exam.  Past Medical History  Diagnosis Date  . Hypertension     dx as at age 39  . Kidney stone   . Migraines   . Anxiety   . History of chicken pox    Past Surgical History  Procedure Laterality Date  . Wisdom tooth extraction     Family History  Problem Relation Age of Onset  . Stroke Paternal Grandmother   . Heart attack Paternal Grandmother   . Stroke Maternal Grandmother   . Diabetes Maternal Grandfather   . Heart disease Maternal Grandfather   . Colon cancer Neg Hx   . Breast cancer Neg Hx   . Hypertension Mother    History   Social History  . Marital Status: Married    Spouse Name: N/A    Number of Children: 3  . Years of Education: N/A   Occupational History  . ADMIN ASSISTANT (part time)    Social History Main Topics  . Smoking status: Never Smoker   . Smokeless tobacco: Never Used  . Alcohol Use: Yes     Comment: rarely  . Drug Use: No  . Sexually Active: Yes   Other Topics Concern  . Not on file   Social History Narrative       Review of Systems DIET:Healthy most of the time he EXERCISE:Very active at home, during the summer is hard for her to exercise regularly No  CP, SOB, lower extremity edema No nausea, vomiting diarrhea No blood in the stools No dysuria, gross hematuria, difficulty urinating   No anxiety, depression    Objective:   Physical Exam BP 112/80  Pulse 74  Temp(Src) 98.3 F (36.8 C) (Oral)  Ht 5' 6.5" (1.689 m)  Wt 124 lb 6.4 oz (56.427 kg)  BMI 19.78 kg/m2  SpO2 98%  General -- alert, well-developed, NAd .   Neck --no thyromegaly , normal carotid pulse Lungs -- normal respiratory effort, no intercostal retractions, no accessory muscle use, and normal breath sounds.   Heart-- normal rate, regular rhythm, no murmur, and no gallop.   Abdomen--soft, non-tender, no distention, no masses, no HSM,  no guarding, and no rigidity.   Extremities-- no pretibial edema bilaterally Neurologic-- alert & oriented X3 and strength normal in all extremities. Psych-- Cognition and judgment appear intact. Alert and cooperative with normal attention span and concentration.  not anxious appearing and not depressed appearing.       Assessment & Plan:

## 2012-10-16 NOTE — Patient Instructions (Addendum)
As long as your blood pressure is well-controlled and your headaches are stable I don't  need to see you but once a year. Check the  blood pressure 2 or 3 times a month, be sure it is between 110/60 and 140/85. If it is consistently higher or lower, let me know. Pulse should be > 55

## 2012-10-16 NOTE — Assessment & Plan Note (Signed)
Recently labetalol dose was increased by gynecology for better control of headaches, patient's is currently taking 200 mg twice a day, no side effects, BP in the low side but no sx Plan: no change

## 2012-10-19 ENCOUNTER — Encounter: Payer: Self-pay | Admitting: *Deleted

## 2013-02-08 ENCOUNTER — Other Ambulatory Visit: Payer: Self-pay

## 2013-03-20 ENCOUNTER — Encounter: Payer: Self-pay | Admitting: Internal Medicine

## 2013-03-20 ENCOUNTER — Ambulatory Visit (INDEPENDENT_AMBULATORY_CARE_PROVIDER_SITE_OTHER): Payer: BC Managed Care – PPO | Admitting: Internal Medicine

## 2013-03-20 VITALS — BP 114/70 | HR 72 | Temp 98.3°F | Wt 126.0 lb

## 2013-03-20 DIAGNOSIS — R059 Cough, unspecified: Secondary | ICD-10-CM

## 2013-03-20 DIAGNOSIS — R05 Cough: Secondary | ICD-10-CM

## 2013-03-20 MED ORDER — HYDROCODONE-HOMATROPINE 5-1.5 MG/5ML PO SYRP: 5.0000 mL | ORAL_SOLUTION | Freq: Three times a day (TID) | ORAL | Status: DC | PRN

## 2013-03-20 MED ORDER — AZITHROMYCIN 250 MG PO TABS: ORAL_TABLET | ORAL | Status: DC

## 2013-03-20 NOTE — Progress Notes (Signed)
Pre visit review using our clinic review tool, if applicable. No additional management support is needed unless otherwise documented below in the visit note. 

## 2013-03-20 NOTE — Progress Notes (Signed)
   Subjective:    Patient ID: Denise Roach, female    DOB: 1980/07/19, 32 y.o.   MRN: [id number]  HPI Acute visit, here with her son. 4 weeks history of a dry cough without other symptoms however in the last 4 or 5 days she has developed a URI: Chest congestion, nasal discharge. She also has some chest soreness from frequent coughing. Medication list reviewed, she recently took doxycycline for an unrelated issue.  Past Medical History  Diagnosis Date  . Hypertension     dx as at age 28  . Kidney stone   . Migraines   . Anxiety   . History of chicken pox    Past Surgical History  Procedure Laterality Date  . Wisdom tooth extraction     History  Substance Use Topics  . Smoking status: Never Smoker   . Smokeless tobacco: Never Used  . Alcohol Use: Yes     Comment: rarely    Review of Systems No GERD symptoms. occ wheezing only w/ episodes of severe cough. Had subjective fever times this week, and no chills. Denies any nausea, vomiting, diarrhea.     Objective:   Physical Exam BP 114/70  Pulse 72  Temp(Src) 98.3 F (36.8 C)  Wt 126 lb (57.153 kg)  SpO2 99% General -- alert, well-developed, NAD.  HEENT-- Not pale. TMs normal, throat symmetric, no redness or discharge. Face symmetric, sinuses not tender to palpation. Nose quite  congested.  Lungs -- normal respiratory effort, no intercostal retractions, no accessory muscle use, Or rhonchi, no wheezing or crackles Heart-- normal rate, regular rhythm, no murmur.  Extremities-- no pretibial edema bilaterally  Neurologic--  alert & oriented X3. Speech normal, gait normal, strength normal in all extremities.  Psych-- Cognition and judgment appear intact. Cooperative with normal attention span and concentration. No anxious appearing , no depressed appearing.      Assessment & Plan:  Cough 4 weeks history of dry nonproductive cough with URI symptoms for the last few days only. Plan: We'll treat with Zithromax,  hydrocodone and Nasacort. See instructions.

## 2013-03-20 NOTE — Patient Instructions (Signed)
Rest, fluids , tylenol For cough, take Mucinex DM twice a day as needed  For congestion use OTC Nasocort: 2 nasal sprays on each side of the nose daily until you feel better If the cough is severe use hydrocodone, will make you sleepy, most people take it at night Take the antibiotic as prescribed  (zitrhomax) Call if no better in few days Call anytime if the symptoms are severe

## 2013-10-18 ENCOUNTER — Telehealth: Payer: Self-pay | Admitting: Internal Medicine

## 2013-10-18 ENCOUNTER — Ambulatory Visit (INDEPENDENT_AMBULATORY_CARE_PROVIDER_SITE_OTHER): Payer: BC Managed Care – PPO | Admitting: Internal Medicine

## 2013-10-18 ENCOUNTER — Encounter: Payer: Self-pay | Admitting: Internal Medicine

## 2013-10-18 VITALS — BP 118/81 | HR 71 | Temp 97.8°F | Wt 125.0 lb

## 2013-10-18 DIAGNOSIS — I10 Essential (primary) hypertension: Secondary | ICD-10-CM

## 2013-10-18 DIAGNOSIS — F411 Generalized anxiety disorder: Secondary | ICD-10-CM

## 2013-10-18 DIAGNOSIS — Z0000 Encounter for general adult medical examination without abnormal findings: Secondary | ICD-10-CM

## 2013-10-18 DIAGNOSIS — R51 Headache: Secondary | ICD-10-CM

## 2013-10-18 LAB — BASIC METABOLIC PANEL
BUN: 14 mg/dL (ref 6–23)
CHLORIDE: 105 meq/L (ref 96–112)
CO2: 26 mEq/L (ref 19–32)
Calcium: 9 mg/dL (ref 8.4–10.5)
Creatinine, Ser: 0.9 mg/dL (ref 0.4–1.2)
GFR: 79.55 mL/min (ref >60.00)
Glucose, Bld: 92 mg/dL (ref 70–99)
POTASSIUM: 3.7 meq/L (ref 3.5–5.1)
Sodium: 137 mEq/L (ref 135–145)

## 2013-10-18 LAB — LIPID PANEL
CHOLESTEROL: 201 mg/dL — AB (ref 0–200)
HDL: 87.7 mg/dL (ref >39.00)
LDL CALC: 106 mg/dL — AB (ref 0–99)
NonHDL: 113.3
TRIGLYCERIDES: 38 mg/dL (ref 0.0–149.0)
Total CHOL/HDL Ratio: 2
VLDL: 7.6 mg/dL (ref 0.0–40.0)

## 2013-10-18 MED ORDER — LABETALOL HCL 200 MG PO TABS: 200.0000 mg | ORAL_TABLET | Freq: Two times a day (BID) | ORAL | Status: DC

## 2013-10-18 MED ORDER — ALPRAZOLAM 0.5 MG PO TABS: 0.2500 mg | ORAL_TABLET | Freq: Three times a day (TID) | ORAL | Status: DC | PRN

## 2013-10-18 NOTE — Patient Instructions (Signed)
Get your blood work before you leave   Take xanax as needed, watch for drowsiness  Next visit is for routine check up regards xanax in 6 months, call for refills if needed

## 2013-10-18 NOTE — Assessment & Plan Note (Addendum)
Tdap 07-2011 Gyn per Dr [facility]  Cont w/ healthy lifestyle       

## 2013-10-18 NOTE — Progress Notes (Signed)
   Subjective:    Patient ID: Denise Roach, female    DOB: 1980/12/14, 33 y.o.   MRN: [id number]  DOS:  10/18/2013 Type of visit - description: CPX History: We also discussed other issues, see a/p   ROS Denies chest pain or difficulty breathing. No nausea or vomiting. Occasional diarrhea when anxious. Admits to anxiety but no depression. No dysuria, gross hematuria or difficulty urinating. Occasionally has headaches. No insomnia  Past Medical History  Diagnosis Date  . Hypertension     dx as at age 33  . Kidney stone   . Migraines   . Anxiety   . History of chicken pox     Past Surgical History  Procedure Laterality Date  . Wisdom tooth extraction      History   Social History  . Marital Status: Married    Spouse Name: N/A    Number of Children: 3  . Years of Education: N/A   Occupational History  . ADMIN ASSISTANT (part time)   .     Social History Main Topics  . Smoking status: Never Smoker   . Smokeless tobacco: Never Used  . Alcohol Use: Yes     Comment: rarely  . Drug Use: No  . Sexual Activity: Yes   Other Topics Concern  . Not on file   Social History Narrative            Medication List       This list is accurate as of: 10/18/13  4:45 PM.  Always use your most recent med list.               acetaminophen 500 MG tablet  Commonly known as:  TYLENOL  Take 1,000 mg by mouth every 6 (six) hours as needed.     ACETAMINOPHEN-CAFF-BUTALBITAL 50-500-40 MG Caps  Take 1 tablet by mouth as needed.     ALPRAZolam 0.5 MG tablet  Commonly known as:  XANAX  Take 0.5-1 tablets (0.25-0.5 mg total) by mouth 3 (three) times daily as needed for anxiety.     labetalol 200 MG tablet  Commonly known as:  NORMODYNE  Take 1 tablet (200 mg total) by mouth 2 (two) times daily.     levonorgestrel 20 MCG/24HR IUD  Commonly known as:  MIRENA  1 each by Intrauterine route once.     multivitamin tablet  Take 1 tablet by mouth daily.             Objective:   Physical Exam BP 118/81  Pulse 71  Temp(Src) 97.8 F (36.6 C)  Wt 125 lb (56.7 kg)  SpO2 100%  General -- alert, well-developed, NAD.  Neck --no thyromegaly HEENT-- Not pale. Lungs -- normal respiratory effort, no intercostal retractions, no accessory muscle use, and normal breath sounds.  Heart-- normal rate, regular rhythm, no murmur.  Abdomen-- Not distended, good bowel sounds,soft, non-tender. + palpable Ao upper abd, + bruit  Extremities-- no pretibial edema bilaterally  Neurologic--  alert & oriented X3. Speech normal, gait appropriate for age, strength symmetric and appropriate for age.  Psych-- Cognition and judgment appear intact. Cooperative with normal attention span and concentration. No anxious or depressed appearing.         Assessment & Plan:

## 2013-10-18 NOTE — Telephone Encounter (Signed)
Caller name: Asal Relation to OZ:HYQMVHQpt:patient  Call back number:[phone] Pharmacy:[id number] - [place], [place] - [address]   Reason for call: to request a refill for Labetalol a 90 day supply.

## 2013-10-18 NOTE — Assessment & Plan Note (Signed)
Dx  at age 33, before she established with me she saw nephrology's, secondary causes of hypertension were r/o, remembers having an abd "MRA" On exam she had a palpable aorta which is not a new finding. Plan: Continue labetalol, at some point consider check the renal arteries.

## 2013-10-18 NOTE — Telephone Encounter (Signed)
Done

## 2013-10-18 NOTE — Progress Notes (Signed)
Pre visit review using our clinic review tool, if applicable. No additional management support is needed unless otherwise documented below in the visit note. 

## 2013-10-18 NOTE — Assessment & Plan Note (Signed)
Well-controlled with prn meds, will call for refills as needed

## 2013-10-18 NOTE — Assessment & Plan Note (Signed)
Complains of episodic anxiety, no depression or insomnia. In the past she tried SSRIs and although they helped  but she felt like "she didn't care" about anything . Would like a prn medication. I suggest Xanax as needed, warned about potential for addiction, drowsiness, pregnancy category, etc Plan: Xanax when necessary, see prescription

## 2013-10-19 ENCOUNTER — Telehealth: Payer: Self-pay | Admitting: Internal Medicine

## 2013-10-19 NOTE — Telephone Encounter (Signed)
Relevant patient education assigned to patient using Emmi. ° °

## 2013-11-03 LAB — HM PAP SMEAR: HM Pap smear: NORMAL

## 2014-02-04 ENCOUNTER — Encounter: Payer: Self-pay | Admitting: Internal Medicine

## 2014-04-23 ENCOUNTER — Ambulatory Visit (INDEPENDENT_AMBULATORY_CARE_PROVIDER_SITE_OTHER): Payer: BLUE CROSS/BLUE SHIELD | Admitting: Internal Medicine

## 2014-04-23 ENCOUNTER — Encounter: Payer: Self-pay | Admitting: Internal Medicine

## 2014-04-23 VITALS — BP 126/82 | HR 70 | Temp 98.0°F | Ht 67.0 in | Wt 125.0 lb

## 2014-04-23 DIAGNOSIS — G43909 Migraine, unspecified, not intractable, without status migrainosus: Secondary | ICD-10-CM

## 2014-04-23 DIAGNOSIS — I10 Essential (primary) hypertension: Secondary | ICD-10-CM

## 2014-04-23 DIAGNOSIS — F411 Generalized anxiety disorder: Secondary | ICD-10-CM

## 2014-04-23 MED ORDER — SUMATRIPTAN SUCCINATE 6 MG/0.5ML ~~LOC~~ SOCT: 6.0000 mg | SUBCUTANEOUS | Status: DC | PRN

## 2014-04-23 MED ORDER — ALPRAZOLAM 0.5 MG PO TABS: 0.2500 mg | ORAL_TABLET | Freq: Three times a day (TID) | ORAL | Status: DC | PRN

## 2014-04-23 MED ORDER — BUTALBITAL-APAP-CAFFEINE 50-325-40 MG PO TABS: 1.0000 | ORAL_TABLET | Freq: Three times a day (TID) | ORAL | Status: AC | PRN

## 2014-04-23 NOTE — Patient Instructions (Addendum)
Please take medications as prescribed If the headaches are more frequent, intense or different: Call the office  Check the  blood pressure   weekly  Be sure your blood pressure is between 110/65 and  145/85.  if it is consistently higher or lower, let me know    Please come back to the office in 6 months  for a physical exam. Come back fasting

## 2014-04-23 NOTE — Assessment & Plan Note (Signed)
Xanax as needed works well for her, prescription printed, check a UDS

## 2014-04-23 NOTE — Progress Notes (Signed)
Pre visit review using our clinic review tool, if applicable. No additional management support is needed unless otherwise documented below in the visit note. 

## 2014-04-23 NOTE — Progress Notes (Signed)
Subjective:    Patient ID: Denise Roach, female    DOB: [birth year], 34 y.o.   MRN: [id number]  DOS:  04/23/2014 Type of visit - description : rov Interval history: Hypertension, good medication compliance, BP usually okay except when she went to see her gynecologist was elevated, at the time she was taking Sudafed for few days for a URI. Headaches, long history of migraines, had a severe one Christmas Day, she vomited several times and couldn't keek  Fioricet down, the headache continue, eventually she use a injectable sumatriptan from her sister and responded very quickly, request a prescription. Anxiety, on Xanax, needs a refill. Due for a UDS, medication works well for her   ROS Denies fever or chills, no sinus pain or congestion Stress at baseline, did have a stressful Christmas. Denies any unusual headaches, all the headaches she has  so far are consistent with her history of migraines  Past Medical History  Diagnosis Date  . Hypertension     dx as at age 22  . Kidney stone   . Migraines   . Anxiety   . History of chicken pox     Past Surgical History  Procedure Laterality Date  . Wisdom tooth extraction      History   Social History  . Marital Status: Married    Spouse Name: N/A    Number of Children: 3  . Years of Education: N/A   Occupational History  . ADMIN ASSISTANT (part time)   .     Social History Main Topics  . Smoking status: Never Smoker   . Smokeless tobacco: Never Used  . Alcohol Use: Yes     Comment: rarely  . Drug Use: No  . Sexual Activity: Yes   Other Topics Concern  . Not on file   Social History Narrative            Medication List       This list is accurate as of: 04/23/14  5:52 PM.  Always use your most recent med list.               acetaminophen 500 MG tablet  Commonly known as:  TYLENOL  Take 1,000 mg by mouth every 6 (six) hours as needed.     ALPRAZolam 0.5 MG tablet  Commonly known as:  XANAX  Take 0.5-1  tablets (0.25-0.5 mg total) by mouth 3 (three) times daily as needed for anxiety.     butalbital-acetaminophen-caffeine 50-325-40 MG per tablet  Commonly known as:  FIORICET  Take 1-2 tablets by mouth every 8 (eight) hours as needed for headache.     labetalol 200 MG tablet  Commonly known as:  NORMODYNE  Take 1 tablet (200 mg total) by mouth 2 (two) times daily.     levonorgestrel 20 MCG/24HR IUD  Commonly known as:  MIRENA  1 each by Intrauterine route once.     multivitamin tablet  Take 1 tablet by mouth daily.     SUMAtriptan Succinate 6 MG/0.5ML Soct  Inject 6 mg as directed every 2 (two) hours as needed. No more than 2 doses in a 24 hour period           Objective:   Physical Exam BP 126/82 mmHg  Pulse 70  Temp(Src) 98 F (36.7 C) (Oral)  Ht  (1.702 m)  Wt 125 lb (56.7 kg)  BMI 19.57 kg/m2  SpO2 100% General -- alert, well-developed, NAD.  HEENT-- Not pale.  R  Ear-- normal L ear-- normal Throat symmetric, no redness or discharge. Face symmetric, sinuses not tender to palpation. Nose not congested. Lungs -- normal respiratory effort, no intercostal retractions, no accessory muscle use, and normal breath sounds.  Heart-- normal rate, regular rhythm, no murmur.   Extremities-- no pretibial edema bilaterally  Neurologic--  alert & oriented X3. Speech normal, gait appropriate for age, strength symmetric and appropriate for age.  DTRs symmetric. EOMI, PERLA   Psych-- Cognition and judgment appear intact. Cooperative with normal attention span and concentration. No anxious or depressed appearing.     Assessment & Plan:   Today , I spent more than  25  min with the patient: >50% of the time counseling regards treatment of migraines, pros and cons of preventive medication

## 2014-04-23 NOTE — Assessment & Plan Note (Signed)
Long history of migraines, usually has mild episodes twice a month, they respond quickly to Fioricet, twice a year has severe episodes that are harder to control. Used to take Maxalt without problems. But her  recent severe headache responded very well to subcutaneous Imitrex, request a prescription. We talk about possibly preventive medication but she feels that things are very well as long as she can control quickly her migraines. We agree on continue with Fioricet, use Imitrex subcutaneous as needed. Declined a prescription for Phenergan.

## 2014-04-23 NOTE — Assessment & Plan Note (Signed)
Well controlled on labetalol, monitory ambulatory BPs

## 2014-05-03 ENCOUNTER — Telehealth: Payer: Self-pay

## 2014-05-03 NOTE — Telephone Encounter (Signed)
UDS: 04/23/2014  Negative for Alprazolam: PRN Negative for Butalbital   Low risk per Dr. Drue NovelPaz 05/03/2014

## 2014-08-29 ENCOUNTER — Other Ambulatory Visit: Payer: Self-pay | Admitting: Internal Medicine

## 2014-08-29 NOTE — Telephone Encounter (Signed)
Ok 6 and 2 rf

## 2014-08-29 NOTE — Telephone Encounter (Signed)
Rx sent to pharmacy   

## 2014-08-29 NOTE — Telephone Encounter (Signed)
Pt is requesting refill on Sumatriptan injectable.  Last OV: 04/23/2014, CPE scheduled for 10/2014 Last Fill: 04/23/2014 # 6 syringes 2RF   Please advise.

## 2014-10-08 ENCOUNTER — Telehealth: Payer: Self-pay | Admitting: Internal Medicine

## 2014-10-08 NOTE — Telephone Encounter (Signed)
pre visit letter mailed 10/02/14 °

## 2014-10-22 ENCOUNTER — Encounter: Payer: Self-pay | Admitting: *Deleted

## 2014-10-22 ENCOUNTER — Telehealth: Payer: Self-pay | Admitting: *Deleted

## 2014-10-22 ENCOUNTER — Other Ambulatory Visit: Payer: Self-pay

## 2014-10-22 NOTE — Telephone Encounter (Signed)
Pre-Visit Call completed with patient and chart updated.   Pre-Visit Info documented in Specialty Comments under [employer].    

## 2014-10-22 NOTE — Telephone Encounter (Signed)
Unable to reach patient at time of Pre-Visit Call.  Left message for patient to return call when available.    

## 2014-10-22 NOTE — Addendum Note (Signed)
Addended by: Noreene LarssonLARSON, Bedie Dominey A on: 10/22/2014 04:50 PM   Modules accepted: Medications

## 2014-10-23 ENCOUNTER — Ambulatory Visit (INDEPENDENT_AMBULATORY_CARE_PROVIDER_SITE_OTHER): Payer: BLUE CROSS/BLUE SHIELD | Admitting: Internal Medicine

## 2014-10-23 ENCOUNTER — Encounter: Payer: Self-pay | Admitting: Internal Medicine

## 2014-10-23 VITALS — BP 116/72 | HR 61 | Temp 98.2°F | Ht 67.0 in | Wt 125.5 lb

## 2014-10-23 DIAGNOSIS — I10 Essential (primary) hypertension: Secondary | ICD-10-CM

## 2014-10-23 DIAGNOSIS — Z23 Encounter for immunization: Secondary | ICD-10-CM

## 2014-10-23 DIAGNOSIS — Z0000 Encounter for general adult medical examination without abnormal findings: Secondary | ICD-10-CM

## 2014-10-23 DIAGNOSIS — G43909 Migraine, unspecified, not intractable, without status migrainosus: Secondary | ICD-10-CM

## 2014-10-23 DIAGNOSIS — F411 Generalized anxiety disorder: Secondary | ICD-10-CM

## 2014-10-23 LAB — BASIC METABOLIC PANEL
BUN: 11 mg/dL (ref 6–23)
CALCIUM: 9.2 mg/dL (ref 8.4–10.5)
CHLORIDE: 102 meq/L (ref 96–112)
CO2: 28 mEq/L (ref 19–32)
CREATININE: 0.84 mg/dL (ref 0.40–1.20)
GFR: 82.33 mL/min (ref >60.00)
Glucose, Bld: 87 mg/dL (ref 70–99)
Potassium: 3.8 mEq/L (ref 3.5–5.1)
Sodium: 138 mEq/L (ref 135–145)

## 2014-10-23 LAB — CBC WITH DIFFERENTIAL/PLATELET
BASOS ABS: 0 10*3/uL (ref 0.0–0.1)
BASOS PCT: 0.8 % (ref 0.0–3.0)
Eosinophils Absolute: 0.1 10*3/uL (ref 0.0–0.7)
Eosinophils Relative: 2.5 % (ref 0.0–5.0)
HCT: 38.9 % (ref 36.0–46.0)
HEMOGLOBIN: 13.1 g/dL (ref 12.0–15.0)
Lymphocytes Relative: 45.2 % (ref 12.0–46.0)
Lymphs Abs: 1.6 10*3/uL (ref 0.7–4.0)
MCHC: 33.6 g/dL (ref 30.0–36.0)
MCV: 89.1 fl (ref 78.0–100.0)
Monocytes Absolute: 0.3 10*3/uL (ref 0.1–1.0)
Monocytes Relative: 9 % (ref 3.0–12.0)
NEUTROS ABS: 1.5 10*3/uL (ref 1.4–7.7)
NEUTROS PCT: 42.5 % — AB (ref 43.0–77.0)
Platelets: 222 10*3/uL (ref 150.0–400.0)
RBC: 4.37 Mil/uL (ref 3.87–5.11)
RDW: 13.5 % (ref 11.5–15.5)
WBC: 3.6 10*3/uL — ABNORMAL LOW (ref 4.0–10.5)

## 2014-10-23 LAB — LIPID PANEL
CHOLESTEROL: 213 mg/dL — AB (ref 0–200)
HDL: 94.5 mg/dL (ref >39.00)
LDL CALC: 108 mg/dL — AB (ref 0–99)
NonHDL: 118.5
Total CHOL/HDL Ratio: 2
Triglycerides: 52 mg/dL (ref 0.0–149.0)
VLDL: 10.4 mg/dL (ref 0.0–40.0)

## 2014-10-23 LAB — TSH: TSH: 1.07 u[IU]/mL (ref 0.35–4.50)

## 2014-10-23 LAB — HIV ANTIBODY (ROUTINE TESTING W REFLEX): HIV 1&2 Ab, 4th Generation: NONREACTIVE

## 2014-10-23 LAB — AST: AST: 19 U/L (ref 0–37)

## 2014-10-23 LAB — ALT: ALT: 11 U/L (ref 0–35)

## 2014-10-23 MED ORDER — ALPRAZOLAM 0.5 MG PO TABS: 0.2500 mg | ORAL_TABLET | Freq: Three times a day (TID) | ORAL | Status: DC | PRN

## 2014-10-23 MED ORDER — LABETALOL HCL 200 MG PO TABS: 200.0000 mg | ORAL_TABLET | Freq: Two times a day (BID) | ORAL | Status: DC

## 2014-10-23 MED ORDER — SUMATRIPTAN SUCCINATE 6 MG/0.5ML ~~LOC~~ SOAJ: 6.0000 mg | SUBCUTANEOUS | Status: DC | PRN

## 2014-10-23 NOTE — Progress Notes (Signed)
Pre visit review using our clinic review tool, if applicable. No additional management support is needed unless otherwise documented below in the visit note. 

## 2014-10-23 NOTE — Assessment & Plan Note (Addendum)
imitrex works faster [employer] Fioricet consequently is usually taking Imitrex. On average one or 2 times a month. Plan: Continue with Imitrex as needed; keep some Fioricet for as needed use as well

## 2014-10-23 NOTE — Assessment & Plan Note (Signed)
States that currently alprazolam prn is working better than daily Zoloft. Feeling well. RF medications, check a UDS

## 2014-10-23 NOTE — Patient Instructions (Addendum)
Get your blood work before you leave  We also need a urine sample    Check the  blood pressure 2 or 3 times a month    Be sure your blood pressure is between 110/65 and  145/85.  if it is consistently higher or lower, let me know

## 2014-10-23 NOTE — Assessment & Plan Note (Signed)
Tdap 07-2011 [facility]yn [facility]  Cont w/ healthy lifestyle

## 2014-10-23 NOTE — Assessment & Plan Note (Signed)
Controlled, ambulatory BPs wnl.

## 2014-10-23 NOTE — Progress Notes (Signed)
Subjective:    Patient ID: Denise Roach, female    DOB: [birth year], 34 y.o.   MRN: [id number]  DOS:  10/23/2014 Type of visit - description : CPX Interval history: In general feeling well. Chronic medical problems seem well-controlled, good compliance of medications.Currently using more Imitrex than Fioricet b/c gets better results.   Review of Systems  Constitutional: No fever. No chills. No unexplained wt changes. No unusual sweats  HEENT: No dental problems, no ear discharge, no facial swelling, no voice changes. No eye discharge, no eye  redness , no  intolerance to light   Respiratory: No wheezing , no  difficulty breathing. No cough , no mucus production  Cardiovascular: No CP, no leg swelling , no  Palpitations  GI: no nausea, no vomiting, no diarrhea , no  abdominal pain.  No blood in the stools. No dysphagia, no odynophagia    Endocrine: No polyphagia, no polyuria , no polydipsia  GU: No dysuria, gross hematuria, difficulty urinating. No urinary urgency, no frequency.  Musculoskeletal: No joint swellings or unusual aches or pains  Skin: No change in the color of the skin, palor , no  Rash  Allergic, immunologic: No environmental allergies , no  food allergies  Neurological: No dizziness no  syncope. No headaches. No diplopia, no slurred, no slurred speech, no motor deficits, no facial  Numbness  Hematological: No enlarged lymph nodes, no easy bruising , no unusual bleedings  Psychiatry: No suicidal ideas, no hallucinations, no beavior problems, no confusion.  stress at baseline, symptoms well controlled with as needed Xanax   Past Medical History  Diagnosis Date  . Hypertension     dx as at age 34  . Kidney stone   . Migraines   . Anxiety   . History of chicken pox     Past Surgical History  Procedure Laterality Date  . Wisdom tooth extraction      History   Social History  . Marital Status: Married    Spouse Name: N/A  . Number of Children:  3  . Years of Education: N/A   Occupational History  . ADMIN ASSISTANT (part time)   .     Social History Main Topics  . Smoking status: Never Smoker   . Smokeless tobacco: Never Used  . Alcohol Use: Yes     Comment: rarely  . Drug Use: No  . Sexual Activity: Yes   Other Topics Concern  . Not on file   Social History Narrative   3 children:    2013   2010   2008     Family History  Problem Relation Age of Onset  . Stroke Paternal Grandmother   . Heart attack Paternal Grandmother   . Stroke Maternal Grandmother   . Diabetes Maternal Grandfather   . Heart disease Maternal Grandfather   . Colon cancer Neg Hx   . Breast cancer Neg Hx   . Hypertension Mother        Medication List       This list is accurate as of: 10/23/14  5:14 PM.  Always use your most recent med list.               acetaminophen 500 MG tablet  Commonly known as:  TYLENOL  Take 1,000 mg by mouth every 6 (six) hours as needed.     ALPRAZolam 0.5 MG tablet  Commonly known as:  XANAX  Take 0.5-1 tablets (0.25-0.5 mg total) by mouth 3 (three)  times daily as needed for anxiety.     butalbital-acetaminophen-caffeine 50-325-40 MG per tablet  Commonly known as:  FIORICET  Take 1-2 tablets by mouth every 8 (eight) hours as needed for headache.     labetalol 200 MG tablet  Commonly known as:  NORMODYNE  Take 1 tablet (200 mg total) by mouth 2 (two) times daily.     levonorgestrel 20 MCG/24HR IUD  Commonly known as:  MIRENA  1 each by Intrauterine route once.     multivitamin tablet  Take 1 tablet by mouth daily.     SUMAtriptan 6 MG/0.5ML Soaj  Inject 6 mg as directed every 2 (two) hours as needed. No more than 2 doses in 24 hour period.           Objective:   Physical Exam BP 116/72 mmHg  Pulse 61  Temp(Src) 98.2 F (36.8 C) (Oral)  Ht  (1.702 m)  Wt 125 lb 8 oz (56.926 kg)  BMI 19.65 kg/m2  SpO2 99%   General:   Well developed, well nourished . NAD.  HEENT:    Normocephalic . Face symmetric, atraumatic. No thyromegaly Lungs:  CTA B Normal respiratory effort, no intercostal retractions, no accessory muscle use. Heart: RRR,  no murmur.  no pretibial edema bilaterally  Abdomen:  Not distended, soft, non-tender. No rebound or rigidity. No mass,organomegaly Skin: Not pale. Not jaundice Neurologic:  alert & oriented X3.  Speech normal, gait appropriate for age and unassisted Psych--  Cognition and judgment appear intact.  Cooperative with normal attention span and concentration.  Behavior appropriate. No anxious or depressed appearing. Assessment & Plan:

## 2014-12-04 ENCOUNTER — Telehealth: Payer: Self-pay | Admitting: Internal Medicine

## 2014-12-04 NOTE — Telephone Encounter (Signed)
Received VM 12/04/14 1:41pm to schedule a 6 month f/u with Dr. Drue Novel from her visit in July. I called and left msg for pt to call us back.

## 2015-06-16 ENCOUNTER — Ambulatory Visit (INDEPENDENT_AMBULATORY_CARE_PROVIDER_SITE_OTHER): Payer: BLUE CROSS/BLUE SHIELD | Admitting: Internal Medicine

## 2015-06-16 ENCOUNTER — Encounter: Payer: Self-pay | Admitting: Internal Medicine

## 2015-06-16 VITALS — BP 126/74 | HR 84 | Temp 97.7°F | Ht 67.0 in | Wt 129.2 lb

## 2015-06-16 DIAGNOSIS — Z09 Encounter for follow-up examination after completed treatment for conditions other than malignant neoplasm: Secondary | ICD-10-CM

## 2015-06-16 DIAGNOSIS — F411 Generalized anxiety disorder: Secondary | ICD-10-CM | POA: Diagnosis not present

## 2015-06-16 DIAGNOSIS — G43909 Migraine, unspecified, not intractable, without status migrainosus: Secondary | ICD-10-CM

## 2015-06-16 DIAGNOSIS — I10 Essential (primary) hypertension: Secondary | ICD-10-CM

## 2015-06-16 MED ORDER — SUMATRIPTAN SUCCINATE 6 MG/0.5ML ~~LOC~~ SOAJ: 6.0000 mg | SUBCUTANEOUS | Status: DC | PRN

## 2015-06-16 NOTE — Progress Notes (Signed)
Pre visit review using our clinic review tool, if applicable. No additional management support is needed unless otherwise documented below in the visit note. 

## 2015-06-16 NOTE — Patient Instructions (Signed)
GO TO THE LAB : provide a urine sample for a UDS   GO TO THE FRONT DESK *Schedule a complete physical exam to be done in 6-8 months  fasting

## 2015-06-16 NOTE — Assessment & Plan Note (Signed)
HTN: Continue labetalol, ambulatory BPs reportedly normal. Anxiety: Well-controlled with Xanax as sporadically, UDS and contract today Migraines: Stable pattern, good response to imitrex, RF sent, have some Fioricet left over but uses it very rarely RTC CPX 6-8 months

## 2015-06-16 NOTE — Progress Notes (Signed)
Subjective:    Patient ID: Denise Roach, female    DOB: 03-31-1981, 35 y.o.   MRN: [id number]  DOS:  06/16/2015 Type of visit - description : Routine office visit Interval history: Migraines well-controlled, sx stable. Anxiety well controlled with sporadic Xanax.   Review of Systems Denies chest pain difficulty breathing No nausea, vomiting, diarrhea  Past Medical History  Diagnosis Date  . Hypertension     dx as at age 35  . Kidney stone   . Migraines   . Anxiety   . History of chicken pox     Past Surgical History  Procedure Laterality Date  . Wisdom tooth extraction      Social History   Social History  . Marital Status: Married    Spouse Name: N/A  . Number of Children: 3  . Years of Education: N/A   Occupational History  . ADMIN ASSISTANT (part time)   .     Social History Main Topics  . Smoking status: Never Smoker   . Smokeless tobacco: Never Used  . Alcohol Use: Yes     Comment: rarely  . Drug Use: No  . Sexual Activity: Yes   Other Topics Concern  . Not on file   Social History Narrative   3 children:    2013   2010   2008        Medication List       This list is accurate as of: 06/16/15  1:23 PM.  Always use your most recent med list.               acetaminophen 500 MG tablet  Commonly known as:  TYLENOL  Take 1,000 mg by mouth every 6 (six) hours as needed.     ALPRAZolam 0.5 MG tablet  Commonly known as:  XANAX  Take 0.5-1 tablets (0.25-0.5 mg total) by mouth 3 (three) times daily as needed for anxiety.     labetalol 200 MG tablet  Commonly known as:  NORMODYNE  Take 1 tablet (200 mg total) by mouth 2 (two) times daily.     levonorgestrel 20 MCG/24HR IUD  Commonly known as:  MIRENA  1 each by Intrauterine route once.     multivitamin tablet  Take 1 tablet by mouth daily.     SUMAtriptan 6 MG/0.5ML Soaj  Inject 6 mg as directed every 2 (two) hours as needed. No more than 2 doses in 24 hour period.          Objective:   Physical Exam BP 126/74 mmHg  Pulse 84  Temp(Src) 97.7 F (36.5 C) (Oral)  Ht 5\' 7"  (1.702 m)  Wt 129 lb 4 oz (58.627 kg)  BMI 20.24 kg/m2  SpO2 97% General:   Well developed, well nourished . NAD.  HEENT:  Normocephalic . Face symmetric, atraumatic Lungs:  CTA B Normal respiratory effort, no intercostal retractions, no accessory muscle use. Heart: RRR,  no murmur.  No pretibial edema bilaterally  Skin: Not pale. Not jaundice Neurologic:  alert & oriented X3.  Speech normal, gait appropriate for age and unassisted Psych--  Cognition and judgment appear intact.  Cooperative with normal attention span and concentration.  Behavior appropriate. No anxious or depressed appearing.      Assessment & Plan:   Assessment  HTN dx age 35 Anxiety-- xanax prn H/o Migraines -- on imitrex  H/o urolithiasis   PLAN  HTN: Continue labetalol, ambulatory BPs reportedly normal. Anxiety: Well-controlled with Xanax as sporadically,  UDS and contract today Migraines: Stable pattern, good response to imitrex, RF sent, have some Fioricet left over but uses it very rarely RTC CPX 6-8 months

## 2015-06-24 ENCOUNTER — Telehealth: Payer: Self-pay

## 2015-06-24 NOTE — Telephone Encounter (Signed)
UDS: 06/16/2015  Negative for Alprazolam:PRN   Low risk per Dr. Drue NovelPaz 06/24/2015

## 2015-07-07 ENCOUNTER — Encounter: Payer: Self-pay | Admitting: Internal Medicine

## 2015-12-29 DIAGNOSIS — Z6820 Body mass index (BMI) 20.0-20.9, adult: Secondary | ICD-10-CM | POA: Diagnosis not present

## 2015-12-29 DIAGNOSIS — Z01419 Encounter for gynecological examination (general) (routine) without abnormal findings: Secondary | ICD-10-CM | POA: Diagnosis not present

## 2015-12-31 ENCOUNTER — Ambulatory Visit: Payer: BLUE CROSS/BLUE SHIELD | Admitting: Internal Medicine

## 2016-01-05 ENCOUNTER — Encounter: Payer: Self-pay | Admitting: Internal Medicine

## 2016-01-05 ENCOUNTER — Ambulatory Visit (INDEPENDENT_AMBULATORY_CARE_PROVIDER_SITE_OTHER): Payer: BLUE CROSS/BLUE SHIELD | Admitting: Internal Medicine

## 2016-01-05 VITALS — BP 108/62 | HR 79 | Temp 98.1°F | Resp 14 | Ht 67.0 in | Wt 127.2 lb

## 2016-01-05 DIAGNOSIS — G43909 Migraine, unspecified, not intractable, without status migrainosus: Secondary | ICD-10-CM | POA: Diagnosis not present

## 2016-01-05 DIAGNOSIS — Z23 Encounter for immunization: Secondary | ICD-10-CM | POA: Diagnosis not present

## 2016-01-05 DIAGNOSIS — F411 Generalized anxiety disorder: Secondary | ICD-10-CM | POA: Diagnosis not present

## 2016-01-05 DIAGNOSIS — I10 Essential (primary) hypertension: Secondary | ICD-10-CM

## 2016-01-05 MED ORDER — SUMATRIPTAN SUCCINATE 6 MG/0.5ML ~~LOC~~ SOAJ: 6.0000 mg | SUBCUTANEOUS | 3 refills | Status: DC | PRN

## 2016-01-05 MED ORDER — ALPRAZOLAM 0.5 MG PO TABS: 0.2500 mg | ORAL_TABLET | Freq: Three times a day (TID) | ORAL | 3 refills | Status: DC | PRN

## 2016-01-05 NOTE — Progress Notes (Signed)
Subjective:    Patient ID: Denise Roach, female    DOB: 1980-08-08, 35 y.o.   MRN: [id number]  DOS:  01/05/2016 Type of visit - description : rov Interval history: HTN: Has decrease labetalol to one tablet daily, BPs remain excellent, she is active and eating healthy. Headaches: Still has headaches 2 or 3 times a month that required Imitrex. Has figured out some triggers. Anxiety: Control with a sporadic use of Xanax, needs a refill, previous  prescription expired.   Review of Systems   Occasional sinus congestion without fever, chills, cough. Very rarely finds mucus pooled in the throat  in the morning. No depression  Past Medical History:  Diagnosis Date  . Anxiety   . History of chicken pox   . Hypertension    dx as at age 35  . Kidney stone   . Migraines     Past Surgical History:  Procedure Laterality Date  . WISDOM TOOTH EXTRACTION      Social History   Social History  . Marital status: Married    Spouse name: N/A  . Number of children: 3  . Years of education: N/A   Occupational History  . ADMIN ASSISTANT (part time) Surveyor, [employer]  .  [employer]   Social History Main Topics  . Smoking status: Never Smoker  . Smokeless tobacco: Never Used  . Alcohol use Yes     Comment: rarely  . Drug use: No  . Sexual activity: Yes   Other Topics Concern  . Not on file   Social History Narrative   3 children:    2013   2010   2008        Medication List       Accurate as of 01/05/16 11:35 AM. Always use your most recent med list.          acetaminophen 500 MG tablet Commonly known as:  TYLENOL Take 1,000 mg by mouth every 6 (six) hours as needed.   ALPRAZolam 0.5 MG tablet Commonly known as:  XANAX Take 0.5-1 tablets (0.25-0.5 mg total) by mouth 3 (three) times daily as needed for anxiety.   labetalol 200 MG tablet Commonly known as:  NORMODYNE Take 1 tablet (200 mg total) by mouth 2 (two) times daily.   levonorgestrel 20  MCG/24HR IUD Commonly known as:  MIRENA 1 each by Intrauterine route once.   multivitamin tablet Take 1 tablet by mouth daily.   SUMAtriptan 6 MG/0.5ML Soaj Inject 6 mg as directed every 2 (two) hours as needed. No more than 2 doses in 24 hour period.          Objective:   Physical Exam BP 108/62 (BP Location: Left Arm, Patient Position: Sitting, Cuff Size: Small)   Pulse 79   Temp 98.1 F (36.7 C) (Oral)   Resp 14   Ht 5\' 7"  (1.702 m)   Wt 127 lb 4 oz (57.7 kg)   SpO2 98%   BMI 19.93 kg/m  General:   Well developed, well nourished . NAD.  HEENT:  Normocephalic . Face symmetric, atraumatic. Left TM normal, right TM slightly bulge but not red. Sinuses no TTP, nose is slightly congested Lungs:  CTA B Normal respiratory effort, no intercostal retractions, no accessory muscle use. Heart: RRR,  no murmur.  No pretibial edema bilaterally  Skin: Not pale. Not jaundice Neurologic:  alert & oriented X3.  Speech normal, gait appropriate for age and unassisted Psych--  Cognition and judgment appear intact.  Cooperative with normal attention span and concentration.  Behavior appropriate. No anxious or depressed appearing.      Assessment & Plan:   Assessment  HTN dx age 65 Anxiety-- xanax prn H/o Migraines -- on imitrex  H/o urolithiasis   PLAN  HTN: Self decrease labetalol to one tablet a day, BP still very good, recommend to decrease labetalol to half tablet twice a day. Monitor BPs Anxiety: RF Xanax, takes it sporadically. Migraines: Uses Imitrex to 3 times a month, triggers are sleep deprivation, periods. From time to time is unavoidable to sleep less hours than normal (ie  when  her children are sick). We talk about possibly use of preventive medication but she is not interested. Would like to explore Botox injections at some point.  RTC   6  months

## 2016-01-05 NOTE — Patient Instructions (Signed)
  GO TO THE FRONT DESK Schedule your next appointment for a  checkup in 6 months   Flonase OTC 2 sprays on each side of the nose every morning.  For blood pressure, decrease labetalol to half tablet twice a day. If your blood pressure increases go back to one tablet twice a day.   Check the  blood pressure 2 or 3 times a month  Be sure your blood pressure is between 110/65 and  135/85. If it is consistently higher or lower, let me know

## 2016-01-05 NOTE — Progress Notes (Signed)
Pre visit review using our clinic review tool, if applicable. No additional management support is needed unless otherwise documented below in the visit note. 

## 2016-01-05 NOTE — Assessment & Plan Note (Signed)
HTN: Self decrease labetalol to one tablet a day, BP still very good, recommend to decrease labetalol to half tablet twice a day. Monitor BPs Anxiety: RF Xanax, takes it sporadically. Migraines: Uses Imitrex to 3 times a month, triggers are sleep deprivation, periods. From time to time is unavoidable to sleep less hours than normal (ie  when  her children are sick). We talk about possibly use of preventive medication but she is not interested. Would like to explore Botox injections at some point.  RTC   6  months

## 2016-05-14 ENCOUNTER — Other Ambulatory Visit: Payer: Self-pay | Admitting: Internal Medicine

## 2016-05-17 ENCOUNTER — Other Ambulatory Visit: Payer: Self-pay | Admitting: Internal Medicine

## 2016-05-17 ENCOUNTER — Telehealth: Payer: Self-pay | Admitting: Internal Medicine

## 2016-05-17 MED ORDER — SUMATRIPTAN SUCCINATE 6 MG/0.5ML ~~LOC~~ SOAJ: 6.0000 mg | SUBCUTANEOUS | 3 refills | Status: DC | PRN

## 2016-05-17 NOTE — Telephone Encounter (Signed)
Relation to WU:JWJXpt:self Call back number:[phone] Pharmacy: [id number] - [place], [place] - [address] [phone] (Phone) [phone] (Fax)     Reason for call:  Patient requesting a refill SUMAtriptan 6 MG/0.5ML SOAJ, patient states she has no refills, please advise

## 2016-05-17 NOTE — Telephone Encounter (Signed)
Rx sent 

## 2016-05-18 ENCOUNTER — Other Ambulatory Visit: Payer: Self-pay | Admitting: Internal Medicine

## 2016-07-12 ENCOUNTER — Ambulatory Visit (INDEPENDENT_AMBULATORY_CARE_PROVIDER_SITE_OTHER): Payer: BLUE CROSS/BLUE SHIELD | Admitting: Internal Medicine

## 2016-07-12 ENCOUNTER — Encounter: Payer: Self-pay | Admitting: Internal Medicine

## 2016-07-12 VITALS — BP 108/70 | HR 99 | Temp 97.8°F | Resp 12 | Ht 67.0 in | Wt 126.5 lb

## 2016-07-12 DIAGNOSIS — G43909 Migraine, unspecified, not intractable, without status migrainosus: Secondary | ICD-10-CM

## 2016-07-12 DIAGNOSIS — F411 Generalized anxiety disorder: Secondary | ICD-10-CM

## 2016-07-12 DIAGNOSIS — I10 Essential (primary) hypertension: Secondary | ICD-10-CM | POA: Diagnosis not present

## 2016-07-12 LAB — COMPREHENSIVE METABOLIC PANEL
ALT: 10 U/L (ref 0–35)
AST: 17 U/L (ref 0–37)
Albumin: 4.7 g/dL (ref 3.5–5.2)
Alkaline Phosphatase: 44 U/L (ref 39–117)
BILIRUBIN TOTAL: 0.6 mg/dL (ref 0.2–1.2)
BUN: 17 mg/dL (ref 6–23)
CALCIUM: 9.4 mg/dL (ref 8.4–10.5)
CHLORIDE: 104 meq/L (ref 96–112)
CO2: 28 mEq/L (ref 19–32)
CREATININE: 0.98 mg/dL (ref 0.40–1.20)
GFR: 68.24 mL/min (ref >60.00)
Glucose, Bld: 103 mg/dL — ABNORMAL HIGH (ref 70–99)
Potassium: 4.1 mEq/L (ref 3.5–5.1)
Sodium: 137 mEq/L (ref 135–145)
Total Protein: 7.5 g/dL (ref 6.0–8.3)

## 2016-07-12 LAB — TSH: TSH: 1.48 u[IU]/mL (ref 0.35–4.50)

## 2016-07-12 LAB — LIPID PANEL
CHOLESTEROL: 229 mg/dL — AB (ref 0–200)
HDL: 93.4 mg/dL (ref >39.00)
LDL CALC: 105 mg/dL — AB (ref 0–99)
NonHDL: 135.45
TRIGLYCERIDES: 153 mg/dL — AB (ref 0.0–149.0)
Total CHOL/HDL Ratio: 2
VLDL: 30.6 mg/dL (ref 0.0–40.0)

## 2016-07-12 LAB — CBC WITH DIFFERENTIAL/PLATELET
BASOS PCT: 0.8 % (ref 0.0–3.0)
Basophils Absolute: 0.1 10*3/uL (ref 0.0–0.1)
Eosinophils Absolute: 0.1 10*3/uL (ref 0.0–0.7)
Eosinophils Relative: 1.2 % (ref 0.0–5.0)
HEMATOCRIT: 41.4 % (ref 36.0–46.0)
HEMOGLOBIN: 13.9 g/dL (ref 12.0–15.0)
LYMPHS PCT: 20.2 % (ref 12.0–46.0)
Lymphs Abs: 1.5 10*3/uL (ref 0.7–4.0)
MCHC: 33.5 g/dL (ref 30.0–36.0)
MCV: 89.2 fl (ref 78.0–100.0)
MONOS PCT: 10.2 % (ref 3.0–12.0)
Monocytes Absolute: 0.8 10*3/uL (ref 0.1–1.0)
NEUTROS ABS: 5 10*3/uL (ref 1.4–7.7)
Neutrophils Relative %: 67.6 % (ref 43.0–77.0)
PLATELETS: 242 10*3/uL (ref 150.0–400.0)
RBC: 4.64 Mil/uL (ref 3.87–5.11)
RDW: 13.5 % (ref 11.5–15.5)
WBC: 7.5 10*3/uL (ref 4.0–10.5)

## 2016-07-12 MED ORDER — ALPRAZOLAM 0.5 MG PO TABS: 0.2500 mg | ORAL_TABLET | Freq: Three times a day (TID) | ORAL | 1 refills | Status: DC | PRN

## 2016-07-12 MED ORDER — SUMATRIPTAN SUCCINATE 6 MG/0.5ML ~~LOC~~ SOAJ: 6.0000 mg | SUBCUTANEOUS | 3 refills | Status: DC | PRN

## 2016-07-12 NOTE — Patient Instructions (Signed)
GO TO THE LAB : Get the blood work  and provide a urine sample for a UDS   GO TO THE FRONT DESK Schedule your next appointment for a  checkup in 6 months

## 2016-07-12 NOTE — Progress Notes (Signed)
Subjective:    Patient ID: Denise Roach, female    DOB: 1980/09/12, 36 y.o.   MRN: [id number]  DOS:  07/12/2016 Type of visit - description : Routine checkup Interval history: HTN: BP is well-controlled, usually slightly lower than 120. Migraines: Having episodes usually once a month, they last 3 days, typically takes Imitrex 1 tab a day for the 2 or 3 days she has the HA w/  Good response.  Review of Systems  No major issues with anxiety, on Xanax as needed.  Past Medical History:  Diagnosis Date  . Anxiety   . History of chicken pox   . Hypertension    dx as at age 5  . Kidney stone   . Migraines     Past Surgical History:  Procedure Laterality Date  . WISDOM TOOTH EXTRACTION      Social History   Social History  . Marital status: Married    Spouse name: N/A  . Number of children: 3  . Years of education: N/A   Occupational History  . ADMIN ASSISTANT (part time) Surveyor, minerals  .  [employer]   Social History Main Topics  . Smoking status: Never Smoker  . Smokeless tobacco: Never Used  . Alcohol use Yes     Comment: rarely  . Drug use: No  . Sexual activity: Yes   Other Topics Concern  . Not on file   Social History Narrative   3 children:    2013   2010   2008      Allergies as of 07/12/2016   No Known Allergies     Medication List       Accurate as of 07/12/16 11:59 PM. Always use your most recent med list.          acetaminophen 500 MG tablet Commonly known as:  TYLENOL Take 1,000 mg by mouth every 6 (six) hours as needed.   ALPRAZolam 0.5 MG tablet Commonly known as:  XANAX Take 0.5-1 tablets (0.25-0.5 mg total) by mouth 3 (three) times daily as needed for anxiety.   labetalol 200 MG tablet Commonly known as:  NORMODYNE Take 100 mg by mouth 2 (two) times daily.   levonorgestrel 20 MCG/24HR IUD Commonly known as:  MIRENA 1 each by Intrauterine route once.   multivitamin tablet Take 1 tablet by mouth daily.     SUMAtriptan 6 MG/0.5ML Soaj Inject 6 mg as directed every 2 (two) hours as needed. No more than 2 doses in 24 hour period.          Objective:   Physical Exam BP 108/70 (BP Location: Left Arm, Patient Position: Sitting, Cuff Size: Small)   Pulse 99   Temp 97.8 F (36.6 C) (Oral)   Resp 12   Ht  (1.702 m)   Wt 126 lb 8 oz (57.4 kg)   SpO2 99%   BMI 19.81 kg/m  General:   Well developed, well nourished . NAD.  HEENT:  Normocephalic . Face symmetric, atraumatic Lungs:  CTA B Normal respiratory effort, no intercostal retractions, no accessory muscle use. Heart: RRR,  no murmur.  No pretibial edema bilaterally  Skin: Not pale. Not jaundice Neurologic:  alert & oriented X3.  Speech normal, gait appropriate for age and unassisted Psych--  Cognition and judgment appear intact.  Cooperative with normal attention span and concentration.  Behavior appropriate. No anxious or depressed appearing.      Assessment & Plan:   Assessment  HTN dx  age 71 Anxiety-- xanax prn H/o Migraines -- on imitrex  H/o urolithiasis   PLAN  HTN: BP is slightly low today, usually is close to 120 in the ambulatory settings. Continue labetalol 100 mg twice a day. Checking a CMP, TSH, CBC, FLP anxiety: on Xanax as sporadically, UDS and contract today Migraines: Headaches are present only once a month, excellent response to Imitrex. Mirena will be removed in 3 months (husband getting a vasectomy) and she is curious to see how she is going to do regards HAs on IUD is out. If needed, states.  RTC 6 months

## 2016-07-12 NOTE — Progress Notes (Signed)
Pre visit review using our clinic review tool, if applicable. No additional management support is needed unless otherwise documented below in the visit note. 

## 2016-07-13 DIAGNOSIS — Z79899 Other long term (current) drug therapy: Secondary | ICD-10-CM | POA: Diagnosis not present

## 2016-07-13 NOTE — Assessment & Plan Note (Signed)
HTN: BP is slightly low today, usually is close to 120 in the ambulatory settings. Continue labetalol 100 mg twice a day. Checking a CMP, TSH, CBC, FLP anxiety: on Xanax as sporadically, UDS and contract today Migraines: Headaches are present only once a month, excellent response to Imitrex. Mirena will be removed in 3 months (husband getting a vasectomy) and she is curious to see how she is going to do regards HAs on IUD is out. If needed, states.  RTC 6 months

## 2016-07-21 ENCOUNTER — Telehealth: Payer: Self-pay

## 2016-07-21 NOTE — Telephone Encounter (Signed)
UDS: 07/13/2016  Alprazolam: Not Detected Alphra-hydroxyalprazolam-metabolite of alprazolam detected   Low risk per PCP 07/21/2016

## 2016-08-26 DIAGNOSIS — Z30432 Encounter for removal of intrauterine contraceptive device: Secondary | ICD-10-CM | POA: Diagnosis not present

## 2016-11-10 ENCOUNTER — Other Ambulatory Visit: Payer: Self-pay | Admitting: Internal Medicine

## 2016-12-27 ENCOUNTER — Telehealth: Payer: Self-pay | Admitting: Internal Medicine

## 2016-12-27 NOTE — Telephone Encounter (Signed)
Letter released to MyChart. Tried calling Pt, phone rang 3 times and disconnected. This is the only number in the chart that we have on file.

## 2016-12-27 NOTE — Telephone Encounter (Signed)
Pt request letter from [facility] to take on airplane verifying pt has in her possession injection Sumatriptan 6 mg for migraines. Pt leaving town Wednesday early morning 12/29/16. Call pt [phone]. She would like to receive the letter in email if possible or in MyChart but will pick it up no later than tomorrow 5pm.

## 2016-12-27 NOTE — Telephone Encounter (Signed)
Please print a letter To whom it may concern Denise Roach is a patient of mine, she suffers from migraines, she responds well to a medication called sumatriptan which is a injectable. She needs to take the injection with her at all times as migraines are unpredictable.

## 2016-12-27 NOTE — Telephone Encounter (Signed)
Please advise 

## 2017-01-10 ENCOUNTER — Ambulatory Visit (INDEPENDENT_AMBULATORY_CARE_PROVIDER_SITE_OTHER): Payer: BLUE CROSS/BLUE SHIELD | Admitting: Internal Medicine

## 2017-01-10 ENCOUNTER — Encounter: Payer: Self-pay | Admitting: Internal Medicine

## 2017-01-10 VITALS — BP 108/78 | HR 77 | Temp 97.6°F | Resp 14 | Ht 67.0 in | Wt 128.1 lb

## 2017-01-10 DIAGNOSIS — I10 Essential (primary) hypertension: Secondary | ICD-10-CM | POA: Diagnosis not present

## 2017-01-10 DIAGNOSIS — F411 Generalized anxiety disorder: Secondary | ICD-10-CM

## 2017-01-10 DIAGNOSIS — Z23 Encounter for immunization: Secondary | ICD-10-CM | POA: Diagnosis not present

## 2017-01-10 DIAGNOSIS — G43909 Migraine, unspecified, not intractable, without status migrainosus: Secondary | ICD-10-CM | POA: Diagnosis not present

## 2017-01-10 NOTE — Patient Instructions (Signed)
   GO TO THE FRONT DESK Schedule your next appointment for a  routine checkup in 8 months. Please call sooner if needed

## 2017-01-10 NOTE — Progress Notes (Signed)
Pre visit review using our clinic review tool, if applicable. No additional management support is needed unless otherwise documented below in the visit note. 

## 2017-01-10 NOTE — Assessment & Plan Note (Signed)
HTN: Well-controlled on a low dose of beta blockers, last BMP satisfactory. No change Anxiety: Controlled on Xanax as needed, will call for a RF prn Migraines: On Imitrex, no recent problems. Preventive care: Flu shot today RTC a months

## 2017-01-10 NOTE — Progress Notes (Signed)
   Subjective:    Patient ID: Denise Roach, female    DOB: [birth year], 36 y.o.   MRN: [id number]  DOS:  01/10/2017 Type of visit - description : rov Interval history: Anxiety: Well controlled on Xanax as needed Migraines: No recent issues, on Imitrex as needed Good compliance with medications for blood pressure.    Review of Systems  she remains very active, has no major concerns.   Past Medical History:  Diagnosis Date  . Anxiety   . History of chicken pox   . Hypertension    dx as at age 1  . Kidney stone   . Migraines     Past Surgical History:  Procedure Laterality Date  . WISDOM TOOTH EXTRACTION      Social History   Social History  . Marital status: Married    Spouse name: N/A  . Number of children: 3  . Years of education: N/A   Occupational History  . ADMIN ASSISTANT (part time) Surveyor, minerals  .  [employer]   Social History Main Topics  . Smoking status: Never Smoker  . Smokeless tobacco: Never Used  . Alcohol use Yes     Comment: rarely  . Drug use: No  . Sexual activity: Yes   Other Topics Concern  . Not on file   Social History Narrative   3 children:    2013   2010   2008      Allergies as of 01/10/2017   No Known Allergies     Medication List       Accurate as of 01/10/17  4:48 PM. Always use your most recent med list.          acetaminophen 500 MG tablet Commonly known as:  TYLENOL Take 1,000 mg by mouth every 6 (six) hours as needed.   ALPRAZolam 0.5 MG tablet Commonly known as:  XANAX Take 0.5-1 tablets (0.25-0.5 mg total) by mouth 3 (three) times daily as needed for anxiety.   labetalol 200 MG tablet Commonly known as:  NORMODYNE Take 0.5 tablets (100 mg total) by mouth 2 (two) times daily.   multivitamin tablet Take 1 tablet by mouth daily.   SUMAtriptan 6 MG/0.5ML Soaj Inject 6 mg as directed every 2 (two) hours as needed. No more than 2 doses in 24 hour period.          Objective:   Physical Exam BP 108/78 (BP Location: Left Arm, Patient Position: Sitting, Cuff Size: Small)   Pulse 77   Temp 97.6 F (36.4 C) (Oral)   Resp 14   Ht  (1.702 m)   Wt 128 lb 2 oz (58.1 kg)   SpO2 98%   BMI 20.07 kg/m      Assessment & Plan:   Assessment  HTN dx age 42 Anxiety-- xanax prn H/o Migraines -- on imitrex  H/o urolithiasis  Birth control: husband's vasectomy  PLAN  HTN: Well-controlled on a low dose of beta blockers, last BMP satisfactory. No change Anxiety: Controlled on Xanax as needed, will call for a RF prn Migraines: On Imitrex, no recent problems. Preventive care: Flu shot today RTC a months

## 2017-01-28 DIAGNOSIS — M9902 Segmental and somatic dysfunction of thoracic region: Secondary | ICD-10-CM | POA: Diagnosis not present

## 2017-01-28 DIAGNOSIS — M7911 Myalgia of mastication muscle: Secondary | ICD-10-CM | POA: Diagnosis not present

## 2017-01-28 DIAGNOSIS — M9901 Segmental and somatic dysfunction of cervical region: Secondary | ICD-10-CM | POA: Diagnosis not present

## 2017-01-28 DIAGNOSIS — M26602 Left temporomandibular joint disorder, unspecified: Secondary | ICD-10-CM | POA: Diagnosis not present

## 2017-01-31 DIAGNOSIS — M9901 Segmental and somatic dysfunction of cervical region: Secondary | ICD-10-CM | POA: Diagnosis not present

## 2017-01-31 DIAGNOSIS — M9902 Segmental and somatic dysfunction of thoracic region: Secondary | ICD-10-CM | POA: Diagnosis not present

## 2017-01-31 DIAGNOSIS — M7911 Myalgia of mastication muscle: Secondary | ICD-10-CM | POA: Diagnosis not present

## 2017-01-31 DIAGNOSIS — M26602 Left temporomandibular joint disorder, unspecified: Secondary | ICD-10-CM | POA: Diagnosis not present

## 2017-02-01 DIAGNOSIS — Z6820 Body mass index (BMI) 20.0-20.9, adult: Secondary | ICD-10-CM | POA: Diagnosis not present

## 2017-02-01 DIAGNOSIS — Z01419 Encounter for gynecological examination (general) (routine) without abnormal findings: Secondary | ICD-10-CM | POA: Diagnosis not present

## 2017-02-01 LAB — HM PAP SMEAR

## 2017-02-02 DIAGNOSIS — M9901 Segmental and somatic dysfunction of cervical region: Secondary | ICD-10-CM | POA: Diagnosis not present

## 2017-02-02 DIAGNOSIS — M9902 Segmental and somatic dysfunction of thoracic region: Secondary | ICD-10-CM | POA: Diagnosis not present

## 2017-02-02 DIAGNOSIS — M7911 Myalgia of mastication muscle: Secondary | ICD-10-CM | POA: Diagnosis not present

## 2017-02-02 DIAGNOSIS — M26602 Left temporomandibular joint disorder, unspecified: Secondary | ICD-10-CM | POA: Diagnosis not present

## 2017-02-11 DIAGNOSIS — M7911 Myalgia of mastication muscle: Secondary | ICD-10-CM | POA: Diagnosis not present

## 2017-02-11 DIAGNOSIS — M9902 Segmental and somatic dysfunction of thoracic region: Secondary | ICD-10-CM | POA: Diagnosis not present

## 2017-02-11 DIAGNOSIS — M9901 Segmental and somatic dysfunction of cervical region: Secondary | ICD-10-CM | POA: Diagnosis not present

## 2017-02-11 DIAGNOSIS — M26602 Left temporomandibular joint disorder, unspecified: Secondary | ICD-10-CM | POA: Diagnosis not present

## 2017-02-18 DIAGNOSIS — M9902 Segmental and somatic dysfunction of thoracic region: Secondary | ICD-10-CM | POA: Diagnosis not present

## 2017-02-18 DIAGNOSIS — M9901 Segmental and somatic dysfunction of cervical region: Secondary | ICD-10-CM | POA: Diagnosis not present

## 2017-02-18 DIAGNOSIS — M7911 Myalgia of mastication muscle: Secondary | ICD-10-CM | POA: Diagnosis not present

## 2017-02-18 DIAGNOSIS — M26602 Left temporomandibular joint disorder, unspecified: Secondary | ICD-10-CM | POA: Diagnosis not present

## 2017-03-09 DIAGNOSIS — M26602 Left temporomandibular joint disorder, unspecified: Secondary | ICD-10-CM | POA: Diagnosis not present

## 2017-03-09 DIAGNOSIS — M9902 Segmental and somatic dysfunction of thoracic region: Secondary | ICD-10-CM | POA: Diagnosis not present

## 2017-03-09 DIAGNOSIS — M9901 Segmental and somatic dysfunction of cervical region: Secondary | ICD-10-CM | POA: Diagnosis not present

## 2017-03-09 DIAGNOSIS — M7911 Myalgia of mastication muscle: Secondary | ICD-10-CM | POA: Diagnosis not present

## 2017-03-10 ENCOUNTER — Other Ambulatory Visit: Payer: Self-pay | Admitting: Internal Medicine

## 2017-04-08 DIAGNOSIS — M26602 Left temporomandibular joint disorder, unspecified: Secondary | ICD-10-CM | POA: Diagnosis not present

## 2017-04-08 DIAGNOSIS — M7911 Myalgia of mastication muscle: Secondary | ICD-10-CM | POA: Diagnosis not present

## 2017-04-08 DIAGNOSIS — M9901 Segmental and somatic dysfunction of cervical region: Secondary | ICD-10-CM | POA: Diagnosis not present

## 2017-04-08 DIAGNOSIS — M9902 Segmental and somatic dysfunction of thoracic region: Secondary | ICD-10-CM | POA: Diagnosis not present

## 2017-05-06 DIAGNOSIS — M26602 Left temporomandibular joint disorder, unspecified: Secondary | ICD-10-CM | POA: Diagnosis not present

## 2017-05-06 DIAGNOSIS — M9902 Segmental and somatic dysfunction of thoracic region: Secondary | ICD-10-CM | POA: Diagnosis not present

## 2017-05-06 DIAGNOSIS — M7911 Myalgia of mastication muscle: Secondary | ICD-10-CM | POA: Diagnosis not present

## 2017-05-06 DIAGNOSIS — M9901 Segmental and somatic dysfunction of cervical region: Secondary | ICD-10-CM | POA: Diagnosis not present

## 2017-05-09 ENCOUNTER — Telehealth: Payer: Self-pay | Admitting: Internal Medicine

## 2017-05-09 MED ORDER — OSELTAMIVIR PHOSPHATE 75 MG PO CAPS: 75.0000 mg | ORAL_CAPSULE | Freq: Every day | ORAL | 0 refills | Status: DC

## 2017-05-09 NOTE — Telephone Encounter (Signed)
LMOM informing Pt that if she is having symptoms or starts having symptoms she will need to be seen in office- however [facility] Instructed Pt to call office if questions/concerns.

## 2017-05-09 NOTE — Telephone Encounter (Signed)
Please advise 

## 2017-05-09 NOTE — Telephone Encounter (Signed)
If she has symptoms needs to be seen otherwise send:  Tamiflu 75 mg 1 p.o. daily #10 no refills

## 2017-05-09 NOTE — Telephone Encounter (Signed)
Copied from CRM [id number]. Topic: Quick Communication - See Telephone Encounter >> May 09, 2017  1:00 PM Rudi CocoLathan, Annamaria Salah M, NT wrote: CRM for notification. See Telephone encounter for:   05/09/17. Pt. Calling to see if she can have a rx. Sent in for tamiflu Pts. Two children have tested positive for the flu and wanted to know if something could be sent in for her without being seen. Pt. Can be reached at [phone]   [id number] [place], [place] - [address]  [address] [place] [id number]  Phone: [phone] Fax: [phone]

## 2017-05-11 ENCOUNTER — Other Ambulatory Visit: Payer: Self-pay | Admitting: Internal Medicine

## 2017-05-13 NOTE — Telephone Encounter (Signed)
Sent , but later new contract on RTC

## 2017-05-13 NOTE — Telephone Encounter (Signed)
Requesting: ALPRAZolam (XANAX) 0.5 MG tablet Contract: Last controlled substance contract signed on 06/16/15  Last OV: 01/10/17 Last Refill: 07/12/16  Please Advise

## 2017-06-03 DIAGNOSIS — M26602 Left temporomandibular joint disorder, unspecified: Secondary | ICD-10-CM | POA: Diagnosis not present

## 2017-06-03 DIAGNOSIS — M9902 Segmental and somatic dysfunction of thoracic region: Secondary | ICD-10-CM | POA: Diagnosis not present

## 2017-06-03 DIAGNOSIS — M7911 Myalgia of mastication muscle: Secondary | ICD-10-CM | POA: Diagnosis not present

## 2017-06-03 DIAGNOSIS — M9901 Segmental and somatic dysfunction of cervical region: Secondary | ICD-10-CM | POA: Diagnosis not present

## 2017-07-08 DIAGNOSIS — M26602 Left temporomandibular joint disorder, unspecified: Secondary | ICD-10-CM | POA: Diagnosis not present

## 2017-07-08 DIAGNOSIS — M9902 Segmental and somatic dysfunction of thoracic region: Secondary | ICD-10-CM | POA: Diagnosis not present

## 2017-07-08 DIAGNOSIS — M9901 Segmental and somatic dysfunction of cervical region: Secondary | ICD-10-CM | POA: Diagnosis not present

## 2017-07-08 DIAGNOSIS — M7911 Myalgia of mastication muscle: Secondary | ICD-10-CM | POA: Diagnosis not present

## 2017-08-08 DIAGNOSIS — M9902 Segmental and somatic dysfunction of thoracic region: Secondary | ICD-10-CM | POA: Diagnosis not present

## 2017-08-08 DIAGNOSIS — M26602 Left temporomandibular joint disorder, unspecified: Secondary | ICD-10-CM | POA: Diagnosis not present

## 2017-08-08 DIAGNOSIS — M7911 Myalgia of mastication muscle: Secondary | ICD-10-CM | POA: Diagnosis not present

## 2017-08-08 DIAGNOSIS — M9901 Segmental and somatic dysfunction of cervical region: Secondary | ICD-10-CM | POA: Diagnosis not present

## 2017-09-05 DIAGNOSIS — M26602 Left temporomandibular joint disorder, unspecified: Secondary | ICD-10-CM | POA: Diagnosis not present

## 2017-09-05 DIAGNOSIS — M7911 Myalgia of mastication muscle: Secondary | ICD-10-CM | POA: Diagnosis not present

## 2017-09-05 DIAGNOSIS — M9902 Segmental and somatic dysfunction of thoracic region: Secondary | ICD-10-CM | POA: Diagnosis not present

## 2017-09-05 DIAGNOSIS — M9901 Segmental and somatic dysfunction of cervical region: Secondary | ICD-10-CM | POA: Diagnosis not present

## 2017-09-13 ENCOUNTER — Ambulatory Visit: Payer: BLUE CROSS/BLUE SHIELD | Admitting: Internal Medicine

## 2017-09-14 ENCOUNTER — Ambulatory Visit: Payer: BLUE CROSS/BLUE SHIELD | Admitting: Internal Medicine

## 2017-09-14 ENCOUNTER — Encounter: Payer: Self-pay | Admitting: Internal Medicine

## 2017-09-14 VITALS — BP 116/74 | HR 74 | Resp 16 | Ht 67.0 in | Wt 128.4 lb

## 2017-09-14 DIAGNOSIS — I10 Essential (primary) hypertension: Secondary | ICD-10-CM

## 2017-09-14 DIAGNOSIS — Z79899 Other long term (current) drug therapy: Secondary | ICD-10-CM

## 2017-09-14 DIAGNOSIS — F411 Generalized anxiety disorder: Secondary | ICD-10-CM | POA: Diagnosis not present

## 2017-09-14 NOTE — Patient Instructions (Signed)
GO TO THE LAB : Get the blood work     GO TO THE FRONT DESK Schedule your next appointment for a   checkup in 8 to 10 months    Check the  blood pressure  Monthly  Be sure your blood pressure is between 110/65 and  135/85. If it is consistently higher or lower, let me know  Consider meditation app for nighttime  Consider a new mouthguard, Dr. Myrtis SerKatz?.Marland Kitchen

## 2017-09-14 NOTE — Assessment & Plan Note (Signed)
HTN: Well-controlled on beta-blockers, check a BMP Anxiety: On Xanax, UDS and contract today.  Has some stress but does not need to change management at this point.  Doing yoga regularly which is helpful. Migraines: No recent unusual or frequent headaches. She is concerned about teeth grinding, she already has a mouthguard but does not think is working.  Consider see another dentist, Dr. Myrtis SerKatz has a lot of experience w/ dental devices. RTC 8 to 10 months

## 2017-09-14 NOTE — Progress Notes (Signed)
Pre visit review using our clinic review tool, if applicable. No additional management support is needed unless otherwise documented below in the visit note. 

## 2017-09-14 NOTE — Progress Notes (Signed)
Subjective:    Patient ID: Denise Roach, female    DOB: 06/24/1980, 37 y.o.   MRN: [id number]  DOS:  09/14/2017 Type of visit - description : rov Interval history: Anxiety: Well-controlled on Xanax as needed. Headaches: At baseline, no unusual or severe headaches, she believes that teeth grinding at night is sometimes triggering headaches. HTN: Good medication compliance, no ambulatory BPs   Review of Systems Doing great with lifestyle, exercising regularly. No depression  Past Medical History:  Diagnosis Date  . Anxiety   . History of chicken pox   . Hypertension    dx as at age 16  . Kidney stone   . Migraines     Past Surgical History:  Procedure Laterality Date  . WISDOM TOOTH EXTRACTION      Social History   Socioeconomic History  . Marital status: Married    Spouse name: Not on file  . Number of children: 3  . Years of education: Not on file  . Highest education level: Not on file  Occupational History  . Occupation: Advertising account executive (part time)    Employer: [employer]    Employer: [employer]  Social Needs  . Financial resource strain: Not on file  . Food insecurity:    Worry: Not on file    Inability: Not on file  . Transportation needs:    Medical: Not on file    Non-medical: Not on file  Tobacco Use  . Smoking status: Never Smoker  . Smokeless tobacco: Never Used  Substance and Sexual Activity  . Alcohol use: Yes    Comment: rarely  . Drug use: No  . Sexual activity: Yes  Lifestyle  . Physical activity:    Days per week: Not on file    Minutes per session: Not on file  . Stress: Not on file  Relationships  . Social connections:    Talks on phone: Not on file    Gets together: Not on file    Attends religious service: Not on file    Active member of club or organization: Not on file    Attends meetings of clubs or organizations: Not on file    Relationship status: Not on file  . Intimate partner violence:    Fear of  current or ex partner: Not on file    Emotionally abused: Not on file    Physically abused: Not on file    Forced sexual activity: Not on file  Other Topics Concern  . Not on file  Social History Narrative   3 children:    2013   2010   2008      Allergies as of 09/14/2017   No Known Allergies     Medication List        Accurate as of 09/14/17  4:53 PM. Always use your most recent med list.          acetaminophen 500 MG tablet Commonly known as:  TYLENOL Take 1,000 mg by mouth every 6 (six) hours as needed.   ALPRAZolam 0.5 MG tablet Commonly known as:  XANAX TAKE 1/2-1 TABLET BY MOUTH 3 TIMES DAILY AS NEEDED FOR ANXIETY   labetalol 200 MG tablet Commonly known as:  NORMODYNE Take 0.5 tablets (100 mg total) by mouth 2 (two) times daily.   multivitamin tablet Take 1 tablet by mouth daily.   SUMAtriptan 6 MG/0.5ML Soaj Inject 6 mg as directed every 2 (two) hours as needed. No more than 2 doses  in 24 hour period.          Objective:   Physical Exam BP 116/74 (BP Location: Left Arm, Patient Position: Sitting, Cuff Size: Small)   Pulse 74   Resp 16   Ht 5\' 7"  (1.702 m)   Wt 128 lb 6 oz (58.2 kg)   SpO2 97%   BMI 20.11 kg/m  General:   Well developed, NAD, see BMI.  HEENT:  Normocephalic . Face symmetric, atraumatic Lungs:  CTA B Normal respiratory effort, no intercostal retractions, no accessory muscle use. Heart: RRR,  no murmur.  No pretibial edema bilaterally  Skin: Not pale. Not jaundice Neurologic:  alert & oriented X3.  Speech normal, gait appropriate for age and unassisted Psych--  Cognition and judgment appear intact.  Cooperative with normal attention span and concentration.  Behavior appropriate. No anxious or depressed appearing.      Assessment & Plan:    Assessment  HTN dx age 37 Anxiety-- xanax prn (at some point tried SSRIs, did not feel very well with them, prefers prn meds) H/o Migraines -- on imitrex  H/o urolithiasis    Birth control: husband's vasectomy  PLAN  HTN: Well-controlled on beta-blockers, check a BMP Anxiety: On Xanax, UDS and contract today.  Has some stress but does not need to change management at this point.  Doing yoga regularly which is helpful. Migraines: No recent unusual or frequent headaches. She is concerned about teeth grinding, she already has a mouthguard but does not think is working.  Consider see another dentist, Dr. Myrtis SerKatz has a lot of experience w/ dental devices. RTC 8 to 10 months

## 2017-09-15 LAB — BASIC METABOLIC PANEL
BUN: 14 mg/dL (ref 6–23)
CALCIUM: 9.3 mg/dL (ref 8.4–10.5)
CO2: 26 mEq/L (ref 19–32)
CREATININE: 1 mg/dL (ref 0.40–1.20)
Chloride: 104 mEq/L (ref 96–112)
GFR: 66.23 mL/min (ref >60.00)
Glucose, Bld: 89 mg/dL (ref 70–99)
Potassium: 4.1 mEq/L (ref 3.5–5.1)
Sodium: 138 mEq/L (ref 135–145)

## 2017-09-17 LAB — PAIN MGMT, PROFILE 8 W/CONF, U
6 ACETYLMORPHINE: NEGATIVE ng/mL (ref <10)
ALPHAHYDROXYMIDAZOLAM: NEGATIVE ng/mL (ref <50)
ALPHAHYDROXYTRIAZOLAM: NEGATIVE ng/mL (ref <50)
AMPHETAMINES: NEGATIVE ng/mL (ref <500)
Alcohol Metabolites: NEGATIVE ng/mL (ref <500)
Alphahydroxyalprazolam: 30 ng/mL — ABNORMAL HIGH (ref <25)
Aminoclonazepam: NEGATIVE ng/mL (ref <25)
BENZODIAZEPINES: POSITIVE ng/mL — AB (ref <100)
Buprenorphine, Urine: NEGATIVE ng/mL (ref <5)
Cocaine Metabolite: NEGATIVE ng/mL (ref <150)
Creatinine: 23.1 mg/dL
Hydroxyethylflurazepam: NEGATIVE ng/mL (ref <50)
Lorazepam: NEGATIVE ng/mL (ref <50)
MDMA: NEGATIVE ng/mL (ref <500)
Marijuana Metabolite: NEGATIVE ng/mL (ref <20)
NORDIAZEPAM: NEGATIVE ng/mL (ref <50)
OPIATES: NEGATIVE ng/mL (ref <100)
OXAZEPAM: NEGATIVE ng/mL (ref <50)
OXIDANT: NEGATIVE ug/mL (ref <200)
OXYCODONE: NEGATIVE ng/mL (ref <100)
PH: 6.56 (ref 4.5–9.0)
Temazepam: NEGATIVE ng/mL (ref <50)

## 2017-10-19 DIAGNOSIS — N83299 Other ovarian cyst, unspecified side: Secondary | ICD-10-CM | POA: Diagnosis not present

## 2017-10-19 DIAGNOSIS — R1032 Left lower quadrant pain: Secondary | ICD-10-CM | POA: Diagnosis not present

## 2017-10-19 DIAGNOSIS — R82998 Other abnormal findings in urine: Secondary | ICD-10-CM | POA: Diagnosis not present

## 2017-11-02 ENCOUNTER — Telehealth: Payer: Self-pay | Admitting: Internal Medicine

## 2017-11-02 NOTE — Telephone Encounter (Signed)
Sent!

## 2017-11-02 NOTE — Telephone Encounter (Signed)
Pt is requesting refill on alprazolam.   Last OV: 09/14/2017 Last Fill: 05/13/2017 #60 and 1RF UDS: 09/14/2017 Low risk  NCCR printed- no discrepancies noted- sent for scanning   Please advise.

## 2017-11-16 ENCOUNTER — Encounter: Payer: Self-pay | Admitting: Internal Medicine

## 2017-11-16 DIAGNOSIS — R82998 Other abnormal findings in urine: Secondary | ICD-10-CM | POA: Diagnosis not present

## 2017-11-16 DIAGNOSIS — N83209 Unspecified ovarian cyst, unspecified side: Secondary | ICD-10-CM | POA: Diagnosis not present

## 2017-11-16 DIAGNOSIS — R350 Frequency of micturition: Secondary | ICD-10-CM | POA: Diagnosis not present

## 2017-11-22 ENCOUNTER — Encounter: Payer: Self-pay | Admitting: Internal Medicine

## 2017-12-23 DIAGNOSIS — R3915 Urgency of urination: Secondary | ICD-10-CM | POA: Diagnosis not present

## 2017-12-23 DIAGNOSIS — R350 Frequency of micturition: Secondary | ICD-10-CM | POA: Diagnosis not present

## 2017-12-23 DIAGNOSIS — R3121 Asymptomatic microscopic hematuria: Secondary | ICD-10-CM | POA: Diagnosis not present

## 2017-12-29 DIAGNOSIS — R3121 Asymptomatic microscopic hematuria: Secondary | ICD-10-CM | POA: Diagnosis not present

## 2018-01-04 DIAGNOSIS — R3121 Asymptomatic microscopic hematuria: Secondary | ICD-10-CM | POA: Diagnosis not present

## 2018-01-04 DIAGNOSIS — N132 Hydronephrosis with renal and ureteral calculous obstruction: Secondary | ICD-10-CM | POA: Diagnosis not present

## 2018-01-05 ENCOUNTER — Other Ambulatory Visit: Payer: Self-pay | Admitting: Urology

## 2018-01-06 NOTE — H&P (Signed)
Urology Preoperative H&P   Chief Complaint: Left flank pain  History of Present Illness: Denise Roach is a 37 y.o. female who was evaluated for possible urinary tract infection. Urine culture was negative. Her main complaint is urinary frequency every 30 minutes as well as urinary urgency. This is bothersome to her. She does not have it all the time but when she does it is quite bothersome. No associated pain over the bladder. She does state she has a history kidney stones. She has passed several over that her lifespan. She has never required intervention.   CTSS (01/04/18) revealed a 6 mm distal left ureteral calculus. She has been symptomatic for several months. She desires intervention. I discussed ESWL versus ureteroscopy. She would like to try ESWL first   Past Medical History:  Diagnosis Date  . Anxiety   . History of chicken pox   . Hypertension    dx as at age 83  . Kidney stone   . Migraines     Past Surgical History:  Procedure Laterality Date  . WISDOM TOOTH EXTRACTION      Allergies: No Known Allergies  Family History  Problem Relation Age of Onset  . Stroke Paternal Grandmother   . Heart attack Paternal Grandmother   . Stroke Maternal Grandmother   . Diabetes Maternal Grandfather   . Heart disease Maternal Grandfather   . Hypertension Mother   . Colon cancer Neg Hx   . Breast cancer Neg Hx     Social History:  reports that she has never smoked. She has never used smokeless tobacco. She reports that she drinks alcohol. She reports that she does not use drugs.  ROS: A complete review of systems was performed.  All systems are negative except for pertinent findings as noted.  Physical Exam:  Vital signs in last 24 hours: BP: ()/()  Arterial Line BP: ()/()  Constitutional:  Alert and oriented, No acute distress Cardiovascular: Regular rate and rhythm, No JVD Respiratory: Normal respiratory effort, Lungs clear bilaterally GI: Abdomen is soft, nontender,  nondistended, no abdominal masses GU: No CVA tenderness Lymphatic: No lymphadenopathy Neurologic: Grossly intact, no focal deficits Psychiatric: Normal mood and affect  Laboratory Data:  No results for input(s): WBC, HGB, HCT, PLT in the last 72 hours.  No results for input(s): NA, K, CL, GLUCOSE, BUN, CALCIUM, CREATININE in the last 72 hours.  Invalid input(s): CO3   No results found for this or any previous visit (from the past 24 hour(s)). No results found for this or any previous visit (from the past 240 hour(s)).  Renal Function: No results for input(s): CREATININE in the last 168 hours. CrCl cannot be calculated (Patient's most recent lab result is older than the maximum 21 days allowed.).  Radiologic Imaging: No results found.  I independently reviewed the above imaging studies.  Assessment and Plan Denise Roach is a 37 y.o. female with 6 mm left UVJ stone  The risks, benefits and alternatives of LEFT ESWL was discussed with the patient. I described the risks which include arrhythmia, kidney contusion, kidney hemorrhage, need for transfusion, back discomfort, flank ecchymosis, flank abrasion, inability to fracture the stone, inability to pass stone fragments, Steinstrasse, infection associated with obstructing stones, need for an alternative surgical procedure and possible need for repeat shockwave lithotripsy.  The patient voices understanding and wishes to proceed.    Rhoderick Moody, MD 01/06/2018, 1:30 PM  [facility] Pager: [phone]

## 2018-01-09 ENCOUNTER — Encounter (HOSPITAL_COMMUNITY): Payer: Self-pay | Admitting: *Deleted

## 2018-01-09 ENCOUNTER — Encounter (HOSPITAL_COMMUNITY)
Admission: RE | Disposition: A | Discharge status: Home / Self Care | Payer: Self-pay | Source: Other Acute Inpatient Hospital | Attending: Urology

## 2018-01-09 ENCOUNTER — Ambulatory Visit (HOSPITAL_COMMUNITY): Payer: BLUE CROSS/BLUE SHIELD

## 2018-01-09 ENCOUNTER — Other Ambulatory Visit: Payer: Self-pay

## 2018-01-09 ENCOUNTER — Ambulatory Visit (HOSPITAL_COMMUNITY)
Admission: RE | Admit: 2018-01-09 | Discharge: 2018-01-09 | Disposition: A | Discharge status: Home / Self Care | Payer: BLUE CROSS/BLUE SHIELD | Source: Other Acute Inpatient Hospital | Attending: Urology | Admitting: Urology

## 2018-01-09 DIAGNOSIS — N201 Calculus of ureter: Secondary | ICD-10-CM | POA: Insufficient documentation

## 2018-01-09 DIAGNOSIS — N202 Calculus of kidney with calculus of ureter: Secondary | ICD-10-CM | POA: Diagnosis not present

## 2018-01-09 DIAGNOSIS — Z8249 Family history of ischemic heart disease and other diseases of the circulatory system: Secondary | ICD-10-CM | POA: Diagnosis not present

## 2018-01-09 DIAGNOSIS — I10 Essential (primary) hypertension: Secondary | ICD-10-CM | POA: Insufficient documentation

## 2018-01-09 DIAGNOSIS — Z87442 Personal history of urinary calculi: Secondary | ICD-10-CM | POA: Diagnosis not present

## 2018-01-09 HISTORY — PX: EXTRACORPOREAL SHOCK WAVE LITHOTRIPSY: SHX1557

## 2018-01-09 HISTORY — DX: Personal history of urinary calculi: Z87.442

## 2018-01-09 LAB — PREGNANCY, URINE: PREG TEST UR: NEGATIVE

## 2018-01-09 SURGERY — LITHOTRIPSY, ESWL
Anesthesia: LOCAL | Laterality: Left

## 2018-01-09 MED ORDER — SODIUM CHLORIDE 0.9 % IV SOLN
INTRAVENOUS | Status: DC
  Administered 2018-01-09: 09:00:00 via INTRAVENOUS

## 2018-01-09 MED ORDER — DIAZEPAM 5 MG PO TABS
10.0000 mg | ORAL_TABLET | ORAL | Status: AC
  Administered 2018-01-09: 10 mg via ORAL
  Filled 2018-01-09: qty 2

## 2018-01-09 MED ORDER — CIPROFLOXACIN HCL 500 MG PO TABS
500.0000 mg | ORAL_TABLET | ORAL | Status: AC
  Administered 2018-01-09: 500 mg via ORAL
  Filled 2018-01-09: qty 1

## 2018-01-09 MED ORDER — DIPHENHYDRAMINE HCL 25 MG PO CAPS
25.0000 mg | ORAL_CAPSULE | ORAL | Status: AC
  Administered 2018-01-09: 25 mg via ORAL
  Filled 2018-01-09: qty 1

## 2018-01-09 NOTE — Op Note (Signed)
[facility] Operative Note  Treating Physician: Emilygrace Grothe, MD  Pre-op diagnosis: 6 mm left distal ureteral stone  Post-op diagnosis: Same   Procedure: LEFT ESWL  See [facility] OP note scanned into chart. Also because of the size, density, location and other factors that cannot be anticipated I feel this will likely be a staged procedure. This fact supersedes any indication in the scanned [facility] operative note to the contrary    

## 2018-01-10 ENCOUNTER — Encounter (HOSPITAL_COMMUNITY): Payer: Self-pay | Admitting: Urology

## 2018-01-23 ENCOUNTER — Other Ambulatory Visit: Payer: Self-pay | Admitting: Internal Medicine

## 2018-01-23 DIAGNOSIS — N200 Calculus of kidney: Secondary | ICD-10-CM | POA: Diagnosis not present

## 2018-01-24 DIAGNOSIS — N200 Calculus of kidney: Secondary | ICD-10-CM | POA: Diagnosis not present

## 2018-02-08 DIAGNOSIS — I781 Nevus, non-neoplastic: Secondary | ICD-10-CM | POA: Diagnosis not present

## 2018-02-08 DIAGNOSIS — L700 Acne vulgaris: Secondary | ICD-10-CM | POA: Diagnosis not present

## 2018-02-08 DIAGNOSIS — Z01419 Encounter for gynecological examination (general) (routine) without abnormal findings: Secondary | ICD-10-CM | POA: Diagnosis not present

## 2018-02-08 DIAGNOSIS — Z6820 Body mass index (BMI) 20.0-20.9, adult: Secondary | ICD-10-CM | POA: Diagnosis not present

## 2018-02-08 DIAGNOSIS — D229 Melanocytic nevi, unspecified: Secondary | ICD-10-CM | POA: Diagnosis not present

## 2018-02-08 DIAGNOSIS — L821 Other seborrheic keratosis: Secondary | ICD-10-CM | POA: Diagnosis not present

## 2018-05-31 ENCOUNTER — Encounter: Payer: Self-pay | Admitting: Internal Medicine

## 2018-05-31 ENCOUNTER — Ambulatory Visit: Payer: BLUE CROSS/BLUE SHIELD | Admitting: Internal Medicine

## 2018-05-31 VITALS — BP 126/68 | HR 68 | Temp 98.3°F | Resp 16 | Ht 66.0 in | Wt 133.1 lb

## 2018-05-31 DIAGNOSIS — Z1321 Encounter for screening for nutritional disorder: Secondary | ICD-10-CM

## 2018-05-31 DIAGNOSIS — G43909 Migraine, unspecified, not intractable, without status migrainosus: Secondary | ICD-10-CM | POA: Diagnosis not present

## 2018-05-31 DIAGNOSIS — I10 Essential (primary) hypertension: Secondary | ICD-10-CM | POA: Diagnosis not present

## 2018-05-31 LAB — BASIC METABOLIC PANEL
BUN: 11 mg/dL (ref 6–23)
CALCIUM: 9.4 mg/dL (ref 8.4–10.5)
CO2: 28 mEq/L (ref 19–32)
CREATININE: 0.88 mg/dL (ref 0.40–1.20)
Chloride: 102 mEq/L (ref 96–112)
GFR: 71.94 mL/min (ref >60.00)
GLUCOSE: 87 mg/dL (ref 70–99)
Potassium: 3.9 mEq/L (ref 3.5–5.1)
Sodium: 138 mEq/L (ref 135–145)

## 2018-05-31 LAB — LIPID PANEL
Cholesterol: 205 mg/dL — ABNORMAL HIGH (ref 0–200)
HDL: 98.3 mg/dL (ref >39.00)
LDL Cholesterol: 86 mg/dL (ref 0–99)
NONHDL: 106.52
Total CHOL/HDL Ratio: 2
Triglycerides: 101 mg/dL (ref 0.0–149.0)
VLDL: 20.2 mg/dL (ref 0.0–40.0)

## 2018-05-31 NOTE — Progress Notes (Signed)
Subjective:    Patient ID: Denise Roach, female    DOB: 1980/12/23, 38 y.o.   MRN: [id number]  DOS:  05/31/2018 Type of visit - description: rov HTN: Good compliance with medication, BPs are frequently check at different doctors offices and readings are  normal Had an episode of urolithiasis, resolved. Anxiety: Controlled on Xanax as needed    Review of Systems Migraines are at baseline and she continues to good response to Imitrex  Past Medical History:  Diagnosis Date  . Anxiety   . History of chicken pox   . History of kidney stones   . Hypertension    dx as at age 61  . Kidney stone   . Migraines     Past Surgical History:  Procedure Laterality Date  . EXTRACORPOREAL SHOCK WAVE LITHOTRIPSY Left 01/09/2018   Procedure: LEFT EXTRACORPOREAL SHOCK WAVE LITHOTRIPSY ([facility]);  Surgeon: Rene Paci, MD;  Location: [facility];  Service: Urology;  Laterality: Left;  . WISDOM TOOTH EXTRACTION      Social History   Socioeconomic History  . Marital status: Married    Spouse name: Not on file  . Number of children: 3  . Years of education: Not on file  . Highest education level: Not on file  Occupational History  . Occupation: Advertising account executive (part time)    Employer: [employer]    Employer: [employer]  Social Needs  . Financial resource strain: Not on file  . Food insecurity:    Worry: Not on file    Inability: Not on file  . Transportation needs:    Medical: Not on file    Non-medical: Not on file  Tobacco Use  . Smoking status: Never Smoker  . Smokeless tobacco: Never Used  Substance and Sexual Activity  . Alcohol use: Yes    Comment: rarely  . Drug use: No  . Sexual activity: Yes  Lifestyle  . Physical activity:    Days per week: Not on file    Minutes per session: Not on file  . Stress: Not on file  Relationships  . Social connections:    Talks on phone: Not on file    Gets together: Not on file    Attends religious service:  Not on file    Active member of club or organization: Not on file    Attends meetings of clubs or organizations: Not on file    Relationship status: Not on file  . Intimate partner violence:    Fear of current or ex partner: Not on file    Emotionally abused: Not on file    Physically abused: Not on file    Forced sexual activity: Not on file  Other Topics Concern  . Not on file  Social History Narrative   3 children:    2013   2010   2008      Allergies as of 05/31/2018   No Known Allergies     Medication List       Accurate as of May 31, 2018 11:59 PM. Always use your most recent med list.        acetaminophen 500 MG tablet Commonly known as:  TYLENOL Take 1,000 mg by mouth every 6 (six) hours as needed.   ALPRAZolam 0.5 MG tablet Commonly known as:  XANAX TAKE 1/2-1 TABLET BY MOUTH 3 TIMES DAILY AS NEEDED FOR ANXIETY   labetalol 200 MG tablet Commonly known as:  NORMODYNE Take 0.5 tablets (100 mg total)  by mouth 2 (two) times daily.   multivitamin tablet Take 1 tablet by mouth daily.   SUMAtriptan 6 MG/0.5ML Soaj Inject 6 mg as directed every 2 (two) hours as needed. No more than 2 doses in 24 hour period.           Objective:   Physical Exam BP 126/68 (BP Location: Left Arm, Patient Position: Sitting, Cuff Size: Small)   Pulse 68   Temp 98.3 F (36.8 C) (Oral)   Resp 16   Ht 5\' 6"  (1.676 m)   Wt 133 lb 2 oz (60.4 kg)   LMP 05/20/2018 (Exact Date)   SpO2 96%   BMI 21.49 kg/m  General:   Well developed, NAD, BMI noted. HEENT:  Normocephalic . Face symmetric, atraumatic Lungs:  CTA B Normal respiratory effort, no intercostal retractions, no accessory muscle use. Heart: RRR,  no murmur.  No pretibial edema bilaterally  Skin: Not pale. Not jaundice Neurologic:  alert & oriented X3.  Speech normal, gait appropriate for age and unassisted Psych--  Cognition and judgment appear intact.  Cooperative with normal attention span and  concentration.  Behavior appropriate. No anxious or depressed appearing.      Assessment    Assessment  HTN dx age 2 Anxiety-- xanax prn (at some point tried SSRIs, did not feel very well with them, prefers prn meds) H/o Migraines -- on imitrex  H/o urolithiasis , last 01/2018 Birth control: husband's vasectomy  PLAN  HTN: Seems well controlled, recommend ambulatory BPs once a month, continue with normal diet.  Check a BMP and FLP. Urolithiasis: Most recent episode 01-2018, had a lithotripsy, now asymptomatic, will see urology this week.  Checking a BMP. Migraines: Stable. Wonders about vitamin D supplements, currently on no supplements.  Will check levels, recommend to start vitamin D 1000 units daily. Other issues: Has questions about coronavirus,  answered them to the best of my ability RTC 8 months  18 min

## 2018-05-31 NOTE — Patient Instructions (Signed)
GO TO THE LAB : Get the blood work     GO TO THE FRONT DESK Schedule your next appointment for a check up in 8 months      Check the  blood pressure once a monthly Be sure your blood pressure is between 110/65 and  135/85. If it is consistently higher or lower, let me know    Vitamin D-3  OTC: ~ 1,000 units a day

## 2018-05-31 NOTE — Progress Notes (Signed)
Pre visit review using our clinic review tool, if applicable. No additional management support is needed unless otherwise documented below in the visit note. 

## 2018-06-01 LAB — VITAMIN D 25 HYDROXY (VIT D DEFICIENCY, FRACTURES): VITD: 36.08 ng/mL (ref 30.00–100.00)

## 2018-06-01 NOTE — Assessment & Plan Note (Signed)
HTN: Seems well controlled, recommend ambulatory BPs once a month, continue with normal diet.  Check a BMP and FLP. Urolithiasis: Most recent episode 01-2018, had a lithotripsy, now asymptomatic, will see urology this week.  Checking a BMP. Migraines: Stable. Wonders about vitamin D supplements, currently on no supplements.  Will check levels, recommend to start vitamin D 1000 units daily. Other issues: Has questions about coronavirus,  answered them to the best of my ability RTC 8 months

## 2018-06-02 DIAGNOSIS — N200 Calculus of kidney: Secondary | ICD-10-CM | POA: Diagnosis not present

## 2018-06-16 ENCOUNTER — Other Ambulatory Visit: Payer: Self-pay | Admitting: Internal Medicine

## 2018-07-04 ENCOUNTER — Telehealth: Payer: Self-pay | Admitting: Internal Medicine

## 2018-07-04 NOTE — Telephone Encounter (Signed)
Pt left vm stating she needs a call back from someone who works with Dr. Drue Novel. Per vm pt states there is some confusion at the pharmacy about the quantity amount for her sumatriptan.

## 2018-07-05 ENCOUNTER — Other Ambulatory Visit: Payer: Self-pay

## 2018-07-05 MED ORDER — SUMATRIPTAN SUCCINATE 6 MG/0.5ML ~~LOC~~ SOAJ: 6.0000 mg | SUBCUTANEOUS | 3 refills | Status: DC | PRN

## 2018-07-05 NOTE — Telephone Encounter (Signed)
Spoke w/ Pt- she informed that normally boxes come w/ 2 syringes and she normally gets 5 boxes= 10 syringes. Last Rx was sent as 5 syringes. Informed Pt I didn't realize how medication came, apologized and resent new Rx to [facility].

## 2018-07-11 ENCOUNTER — Other Ambulatory Visit: Payer: Self-pay

## 2018-07-11 ENCOUNTER — Telehealth: Payer: Self-pay | Admitting: Internal Medicine

## 2018-07-11 ENCOUNTER — Ambulatory Visit (INDEPENDENT_AMBULATORY_CARE_PROVIDER_SITE_OTHER): Payer: BLUE CROSS/BLUE SHIELD | Admitting: Internal Medicine

## 2018-07-11 DIAGNOSIS — F411 Generalized anxiety disorder: Secondary | ICD-10-CM

## 2018-07-11 DIAGNOSIS — G43909 Migraine, unspecified, not intractable, without status migrainosus: Secondary | ICD-10-CM

## 2018-07-11 MED ORDER — SUMATRIPTAN SUCCINATE 50 MG PO TABS: 50.0000 mg | ORAL_TABLET | ORAL | 0 refills | Status: DC | PRN

## 2018-07-11 NOTE — Telephone Encounter (Signed)
Please advise. Virtual Visit?

## 2018-07-11 NOTE — Telephone Encounter (Signed)
Patient is experiencing increased migraines.  She was told by the pharmacy she cannot refill her injection until later in April.  Patient is requesting a different medication be sent to pharmacy until she can get more injections.  please contact patient. [phone]

## 2018-07-11 NOTE — Progress Notes (Signed)
Subjective:    Patient ID: Denise Roach, female    DOB: March 28, 1981, 38 y.o.   MRN: [id number]  DOS:  07/11/2018 Type of visit - description: Virtual Visit via Video Note  I connected with@ on 07/11/18 at  3:40 PM EDT by a video enabled telemedicine application and verified that I am speaking with the correct person using two identifiers.   THIS ENCOUNTER IS A VIRTUAL VISIT DUE TO COVID-19 - PATIENT WAS NOT SEEN IN THE OFFICE. PATIENT HAS CONSENTED TO VIRTUAL VISIT / TELEMEDICINE VISIT   Location of patient: home  Location of provider: office  I discussed the limitations of evaluation and management by telemedicine and the availability of in person appointments. The patient expressed understanding and agreed to proceed.  History of Present Illness: Acute visit Since the coronavirus pandemia started, she is homeschooling her children and is experiencing increased stress;   had slightly more migraines lately. The reason she is concerned is because d/t a  clerical issue, she was only dispensed 4 sumatriptan injections instead of 10, so she will she will ran out of the injections soon and she won't be able to refill them until April 20. Pt requests oral sumatriptan to be call to have as a backup in case she has more headaches.  In summary, her headaches are not the worst of her life, they have simply been slightly more frequent and she is running out of medication.  Review of Systems Denies fever chills No nausea or vomiting No cough She is following all the hygienic guidelines from the CDC regards COVID-19.   Past Medical History:  Diagnosis Date  . Anxiety   . History of chicken pox   . History of kidney stones   . Hypertension    dx as at age 53  . Kidney stone   . Migraines     Past Surgical History:  Procedure Laterality Date  . EXTRACORPOREAL SHOCK WAVE LITHOTRIPSY Left 01/09/2018   Procedure: LEFT EXTRACORPOREAL SHOCK WAVE LITHOTRIPSY ([facility]);  Surgeon: Rene Paci, MD;  Location: [facility];  Service: Urology;  Laterality: Left;  . WISDOM TOOTH EXTRACTION      Social History   Socioeconomic History  . Marital status: Married    Spouse name: Not on file  . Number of children: 3  . Years of education: Not on file  . Highest education level: Not on file  Occupational History  . Occupation: Advertising account executive (part time)    Employer: [employer]    Employer: [employer]  Social Needs  . Financial resource strain: Not on file  . Food insecurity:    Worry: Not on file    Inability: Not on file  . Transportation needs:    Medical: Not on file    Non-medical: Not on file  Tobacco Use  . Smoking status: Never Smoker  . Smokeless tobacco: Never Used  Substance and Sexual Activity  . Alcohol use: Yes    Comment: rarely  . Drug use: No  . Sexual activity: Yes  Lifestyle  . Physical activity:    Days per week: Not on file    Minutes per session: Not on file  . Stress: Not on file  Relationships  . Social connections:    Talks on phone: Not on file    Gets together: Not on file    Attends religious service: Not on file    Active member of club or organization: Not on file  Attends meetings of clubs or organizations: Not on file    Relationship status: Not on file  . Intimate partner violence:    Fear of current or ex partner: Not on file    Emotionally abused: Not on file    Physically abused: Not on file    Forced sexual activity: Not on file  Other Topics Concern  . Not on file  Social History Narrative   3 children:    2013   2010   2008      Allergies as of 07/11/2018   No Known Allergies     Medication List       Accurate as of July 11, 2018  3:45 PM. Always use your most recent med list.        acetaminophen 500 MG tablet Commonly known as:  TYLENOL Take 1,000 mg by mouth every 6 (six) hours as needed.   ALPRAZolam 0.5 MG tablet Commonly known as:  XANAX TAKE 1/2-1 TABLET BY MOUTH 3  TIMES DAILY AS NEEDED FOR ANXIETY   labetalol 200 MG tablet Commonly known as:  NORMODYNE Take 0.5 tablets (100 mg total) by mouth 2 (two) times daily.   multivitamin tablet Take 1 tablet by mouth daily.   SUMAtriptan 6 MG/0.5ML Soaj Inject 6 mg as directed every 2 (two) hours as needed. No more than 2 doses in 24 hour period.           Objective:   Physical Exam There were no vitals taken for this visit. This is a video conference, she is alert oriented x3 and in no apparent distress    Assessment     Assessment  HTN dx age 38 Anxiety-- xanax prn (at some point tried SSRIs, did not feel very well with them, prefers prn meds) H/o Migraines -- on imitrex  H/o urolithiasis , last 01/2018 Birth control: husband's vasectomy  PLAN Anxiety: Increased lately, homeschooling her children Migraines: See HPI, she is running short of sumatriptan injections and request oral sumatriptan to be sent to her pharmacy.  Will do. COVID-19: She is following all the CDC precautions.      I discussed the assessment and treatment plan with the patient. The patient was provided an opportunity to ask questions and all were answered. The patient agreed with the plan and demonstrated an understanding of the instructions.   The patient was advised to call back or seek an in-person evaluation if the symptoms worsen or if the condition fails to improve as anticipated.

## 2018-07-11 NOTE — Telephone Encounter (Signed)
yes

## 2018-07-11 NOTE — Telephone Encounter (Signed)
Virtual visit scheduled.  

## 2018-07-11 NOTE — Telephone Encounter (Signed)
Denise Pih- can you call Pt- virtual visit this afternoon?

## 2018-07-12 NOTE — Assessment & Plan Note (Signed)
Anxiety: Increased lately, homeschooling her children Migraines: See HPI, she is running short of sumatriptan injections and request oral sumatriptan to be sent to her pharmacy.  Will do. COVID-19: She is following all the CDC precautions.

## 2018-07-25 DIAGNOSIS — F439 Reaction to severe stress, unspecified: Secondary | ICD-10-CM | POA: Diagnosis not present

## 2018-07-25 DIAGNOSIS — G43909 Migraine, unspecified, not intractable, without status migrainosus: Secondary | ICD-10-CM | POA: Diagnosis not present

## 2018-07-25 DIAGNOSIS — Z7189 Other specified counseling: Secondary | ICD-10-CM | POA: Diagnosis not present

## 2018-07-25 DIAGNOSIS — R5383 Other fatigue: Secondary | ICD-10-CM | POA: Diagnosis not present

## 2018-08-08 DIAGNOSIS — I10 Essential (primary) hypertension: Secondary | ICD-10-CM | POA: Diagnosis not present

## 2018-08-08 DIAGNOSIS — G43909 Migraine, unspecified, not intractable, without status migrainosus: Secondary | ICD-10-CM | POA: Diagnosis not present

## 2018-08-08 DIAGNOSIS — E039 Hypothyroidism, unspecified: Secondary | ICD-10-CM | POA: Diagnosis not present

## 2018-08-08 DIAGNOSIS — R5383 Other fatigue: Secondary | ICD-10-CM | POA: Diagnosis not present

## 2018-08-21 ENCOUNTER — Telehealth: Payer: Self-pay | Admitting: Internal Medicine

## 2018-08-22 NOTE — Telephone Encounter (Signed)
Alprazolam refill.   Last OV: 07/11/2018  Last Fill: 11/02/2017 #60 and 1RF UDS: 09/14/2017 Low risk

## 2018-08-22 NOTE — Telephone Encounter (Signed)
Sent!

## 2018-11-06 DIAGNOSIS — E559 Vitamin D deficiency, unspecified: Secondary | ICD-10-CM | POA: Diagnosis not present

## 2018-11-06 DIAGNOSIS — E039 Hypothyroidism, unspecified: Secondary | ICD-10-CM | POA: Diagnosis not present

## 2018-11-06 DIAGNOSIS — E538 Deficiency of other specified B group vitamins: Secondary | ICD-10-CM | POA: Diagnosis not present

## 2018-11-06 DIAGNOSIS — R5383 Other fatigue: Secondary | ICD-10-CM | POA: Diagnosis not present

## 2018-11-06 DIAGNOSIS — I10 Essential (primary) hypertension: Secondary | ICD-10-CM | POA: Diagnosis not present

## 2018-11-17 DIAGNOSIS — R5383 Other fatigue: Secondary | ICD-10-CM | POA: Diagnosis not present

## 2018-11-17 DIAGNOSIS — E559 Vitamin D deficiency, unspecified: Secondary | ICD-10-CM | POA: Diagnosis not present

## 2018-11-17 DIAGNOSIS — E039 Hypothyroidism, unspecified: Secondary | ICD-10-CM | POA: Diagnosis not present

## 2018-11-17 DIAGNOSIS — E538 Deficiency of other specified B group vitamins: Secondary | ICD-10-CM | POA: Diagnosis not present

## 2018-12-04 ENCOUNTER — Other Ambulatory Visit: Payer: Self-pay | Admitting: Internal Medicine

## 2018-12-05 DIAGNOSIS — M546 Pain in thoracic spine: Secondary | ICD-10-CM | POA: Diagnosis not present

## 2019-02-02 ENCOUNTER — Other Ambulatory Visit: Payer: Self-pay

## 2019-02-05 ENCOUNTER — Ambulatory Visit: Payer: BC Managed Care – PPO | Admitting: Internal Medicine

## 2019-02-05 ENCOUNTER — Encounter: Payer: Self-pay | Admitting: Internal Medicine

## 2019-02-05 ENCOUNTER — Other Ambulatory Visit: Payer: Self-pay

## 2019-02-05 VITALS — BP 135/70 | HR 78 | Temp 97.3°F | Resp 16 | Ht 66.0 in | Wt 134.0 lb

## 2019-02-05 DIAGNOSIS — I10 Essential (primary) hypertension: Secondary | ICD-10-CM | POA: Diagnosis not present

## 2019-02-05 DIAGNOSIS — G43909 Migraine, unspecified, not intractable, without status migrainosus: Secondary | ICD-10-CM | POA: Diagnosis not present

## 2019-02-05 DIAGNOSIS — F411 Generalized anxiety disorder: Secondary | ICD-10-CM

## 2019-02-05 NOTE — Patient Instructions (Addendum)
  GO TO THE FRONT DESK Schedule your next appointment   For a physical exam in 4 months     Check the  blood pressure 2 or 3 times a month   BP GOAL is between 110/65 and  135/85. If it is consistently higher or lower, let me know

## 2019-02-05 NOTE — Progress Notes (Signed)
Subjective:    Patient ID: Denise Roach, female    DOB: 1981-03-17, 38 y.o.   MRN: [id number]  DOS:  02/05/2019 Type of visit - description: ROV Since the last office visit, she is doing well. She went to see another practitioner, she had migraines, fatigue. Blood work apparently showed low vitamins and  borderline thyroid disease. She was prescribed Zoloft, B12 and vitamin D supplements.  Also thyroid supplements. Since then migraines have certainly disappear. She is feeling great emotionally. She is not sure if the benefits are from the medications or the fact that she stopped homeschooling her children (less stress).    Review of Systems Denies chest pain, difficulty breathing, lower extremity edema  Past Medical History:  Diagnosis Date  . Anxiety   . B12 deficiency   . History of chicken pox   . History of kidney stones   . Hypertension    dx as at age 72  . Kidney stone   . Migraines   . Vitamin D deficiency     Past Surgical History:  Procedure Laterality Date  . EXTRACORPOREAL SHOCK WAVE LITHOTRIPSY Left 01/09/2018   Procedure: LEFT EXTRACORPOREAL SHOCK WAVE LITHOTRIPSY (ESWL);  Surgeon: Ceasar Mons, MD;  Location: [facility];  Service: Urology;  Laterality: Left;  . WISDOM TOOTH EXTRACTION      Social History   Socioeconomic History  . Marital status: Married    Spouse name: Not on file  . Number of children: 3  . Years of education: Not on file  . Highest education level: Not on file  Occupational History  . Occupation: Chiropractor (part time)    Employer: [employer]    Employer: [employer]  . Financial resource strain: Not on file  . Food insecurity    Worry: Not on file    Inability: Not on file  . Transportation needs    Medical: Not on file    Non-medical: Not on file  Tobacco Use  . Smoking status: Never Smoker  . Smokeless tobacco: Never Used  Substance and Sexual Activity  . Alcohol use:  Yes    Comment: rarely  . Drug use: No  . Sexual activity: Yes  Lifestyle  . Physical activity    Days per week: Not on file    Minutes per session: Not on file  . Stress: Not on file  Relationships  . Social Herbalist on phone: Not on file    Gets together: Not on file    Attends religious service: Not on file    Active member of club or organization: Not on file    Attends meetings of clubs or organizations: Not on file    Relationship status: Not on file  . Intimate partner violence    Fear of current or ex partner: Not on file    Emotionally abused: Not on file    Physically abused: Not on file    Forced sexual activity: Not on file  Other Topics Concern  . Not on file  Social History Narrative   3 children:    2013   2010   2008      Allergies as of 02/05/2019   No Known Allergies     Medication List       Accurate as of February 05, 2019 11:59 PM. If you have any questions, ask your nurse or doctor.        acetaminophen 500 MG tablet  Commonly known as: TYLENOL Take 1,000 mg by mouth every 6 (six) hours as needed.   ALPRAZolam 0.5 MG tablet Commonly known as: XANAX TAKE 1/2-1 TABLET BY MOUTH 3 TIMES DAILY AS NEEDED FOR ANXIETY   B-12 PO Take by mouth.   labetalol 200 MG tablet Commonly known as: NORMODYNE Take 0.5 tablets (100 mg total) by mouth 2 (two) times daily.   multivitamin tablet Take 1 tablet by mouth daily.   Nature-Throid 32.5 MG tablet Generic drug: thyroid Take 32.5 mg by mouth every morning.   sertraline 25 MG tablet Commonly known as: ZOLOFT Take 25 mg by mouth daily.   SUMAtriptan 50 MG tablet Commonly known as: IMITREX Take 1-2 tablets (50-100 mg total) by mouth every 2 (two) hours as needed for migraine. May repeat in 2 hours if headache persists or recurs.  No more than 4 tablets in a 24-hour.   SUMAtriptan 6 MG/0.5ML Soaj Inject 6 mg as directed every 2 (two) hours as needed. No more than 2 doses in 24 hour  period.   VITAMIN D PO Take by mouth.           Objective:   Physical Exam BP 135/70 (BP Location: Left Arm)   Pulse 78   Temp (!) 97.3 F (36.3 C) (Temporal)   Resp 16   Ht 5\' 6"  (1.676 m)   Wt 134 lb (60.8 kg)   LMP 01/15/2019 (Exact Date)   SpO2 100%   BMI 21.63 kg/m  General:   Well developed, NAD, BMI noted. HEENT:  Normocephalic . Face symmetric, atraumatic Lungs:  CTA B Normal respiratory effort, no intercostal retractions, no accessory muscle use. Heart: RRR,  no murmur.  No pretibial edema bilaterally  Skin: Not pale. Not jaundice Neurologic:  alert & oriented X3.  Speech normal, gait appropriate for age and unassisted Psych--  Cognition and judgment appear intact.  Cooperative with normal attention span and concentration.  Behavior appropriate. No anxious or depressed appearing.      Assessment      Assessment  HTN dx age 29 Anxiety-- xanax prn (at some point tried SSRIs, did not feel very well with them, prefers prn meds) H/o Migraines -- on imitrex  H/o urolithiasis , last 01/2018 Birth control: husband's vasectomy  PLAN Anxiety: Since the last visit, she is doing great, was prescribed sertraline by another provider, good compliance and tolerance.  She is not homeschooling her children anymore. HTN: BP was a slightly elevated upon arrival, I rechecked it manually: 135/70.  No change On B12, vitamin D, thyroid supplements: Pros and cons of supplements discussed.  She has an appointment with another practitioner next month for a checkup, I will be happy to see her blood work if requested.. Had a flu shot RTC 4 months CPX

## 2019-02-05 NOTE — Progress Notes (Signed)
Pre visit review using our clinic review tool, if applicable. No additional management support is needed unless otherwise documented below in the visit note. 

## 2019-02-06 NOTE — Assessment & Plan Note (Signed)
Anxiety: Since the last visit, she is doing great, was prescribed sertraline by another provider, good compliance and tolerance.  She is not homeschooling her children anymore. HTN: BP was a slightly elevated upon arrival, I rechecked it manually: 135/70.  No change On B12, vitamin D, thyroid supplements: Pros and cons of supplements discussed.  She has an appointment with another practitioner next month for a checkup, I will be happy to see her blood work if requested.. Had a flu shot RTC 4 months CPX

## 2019-02-15 DIAGNOSIS — I10 Essential (primary) hypertension: Secondary | ICD-10-CM | POA: Diagnosis not present

## 2019-02-15 DIAGNOSIS — E538 Deficiency of other specified B group vitamins: Secondary | ICD-10-CM | POA: Diagnosis not present

## 2019-02-15 DIAGNOSIS — E559 Vitamin D deficiency, unspecified: Secondary | ICD-10-CM | POA: Diagnosis not present

## 2019-02-15 DIAGNOSIS — R5383 Other fatigue: Secondary | ICD-10-CM | POA: Diagnosis not present

## 2019-02-15 DIAGNOSIS — E039 Hypothyroidism, unspecified: Secondary | ICD-10-CM | POA: Diagnosis not present

## 2019-02-19 DIAGNOSIS — D229 Melanocytic nevi, unspecified: Secondary | ICD-10-CM | POA: Diagnosis not present

## 2019-02-19 DIAGNOSIS — D1801 Hemangioma of skin and subcutaneous tissue: Secondary | ICD-10-CM | POA: Diagnosis not present

## 2019-02-19 DIAGNOSIS — L219 Seborrheic dermatitis, unspecified: Secondary | ICD-10-CM | POA: Diagnosis not present

## 2019-02-19 DIAGNOSIS — L700 Acne vulgaris: Secondary | ICD-10-CM | POA: Diagnosis not present

## 2019-02-21 DIAGNOSIS — R5383 Other fatigue: Secondary | ICD-10-CM | POA: Diagnosis not present

## 2019-02-21 DIAGNOSIS — I10 Essential (primary) hypertension: Secondary | ICD-10-CM | POA: Diagnosis not present

## 2019-02-21 DIAGNOSIS — E039 Hypothyroidism, unspecified: Secondary | ICD-10-CM | POA: Diagnosis not present

## 2019-02-21 DIAGNOSIS — G43909 Migraine, unspecified, not intractable, without status migrainosus: Secondary | ICD-10-CM | POA: Diagnosis not present

## 2019-02-21 DIAGNOSIS — Z20828 Contact with and (suspected) exposure to other viral communicable diseases: Secondary | ICD-10-CM | POA: Diagnosis not present

## 2019-04-18 DIAGNOSIS — Z01419 Encounter for gynecological examination (general) (routine) without abnormal findings: Secondary | ICD-10-CM | POA: Diagnosis not present

## 2019-04-18 DIAGNOSIS — Z6821 Body mass index (BMI) 21.0-21.9, adult: Secondary | ICD-10-CM | POA: Diagnosis not present

## 2019-04-19 LAB — HM PAP SMEAR: HM Pap smear: NEGATIVE

## 2019-04-25 ENCOUNTER — Telehealth: Payer: Self-pay | Admitting: Internal Medicine

## 2019-04-26 NOTE — Telephone Encounter (Signed)
Alprazolam refill.   Last OV: 02/05/2019 Last Fill: 08/22/2018 #60 and 1RF Pt sig: 1/2 to 1 tab tid prn UDS: 09/14/2017 Low risk

## 2019-04-26 NOTE — Telephone Encounter (Signed)
Sent!

## 2019-05-09 DIAGNOSIS — Z1152 Encounter for screening for COVID-19: Secondary | ICD-10-CM | POA: Diagnosis not present

## 2019-05-17 IMAGING — CR DG ABDOMEN 1V
1 series · 1 of 1 positions shown · non-contrast
Comparison: None.

CLINICAL DATA: Left ureteral stone.

EXAM:
ABDOMEN - 1 VIEW

[t abdomen supine]
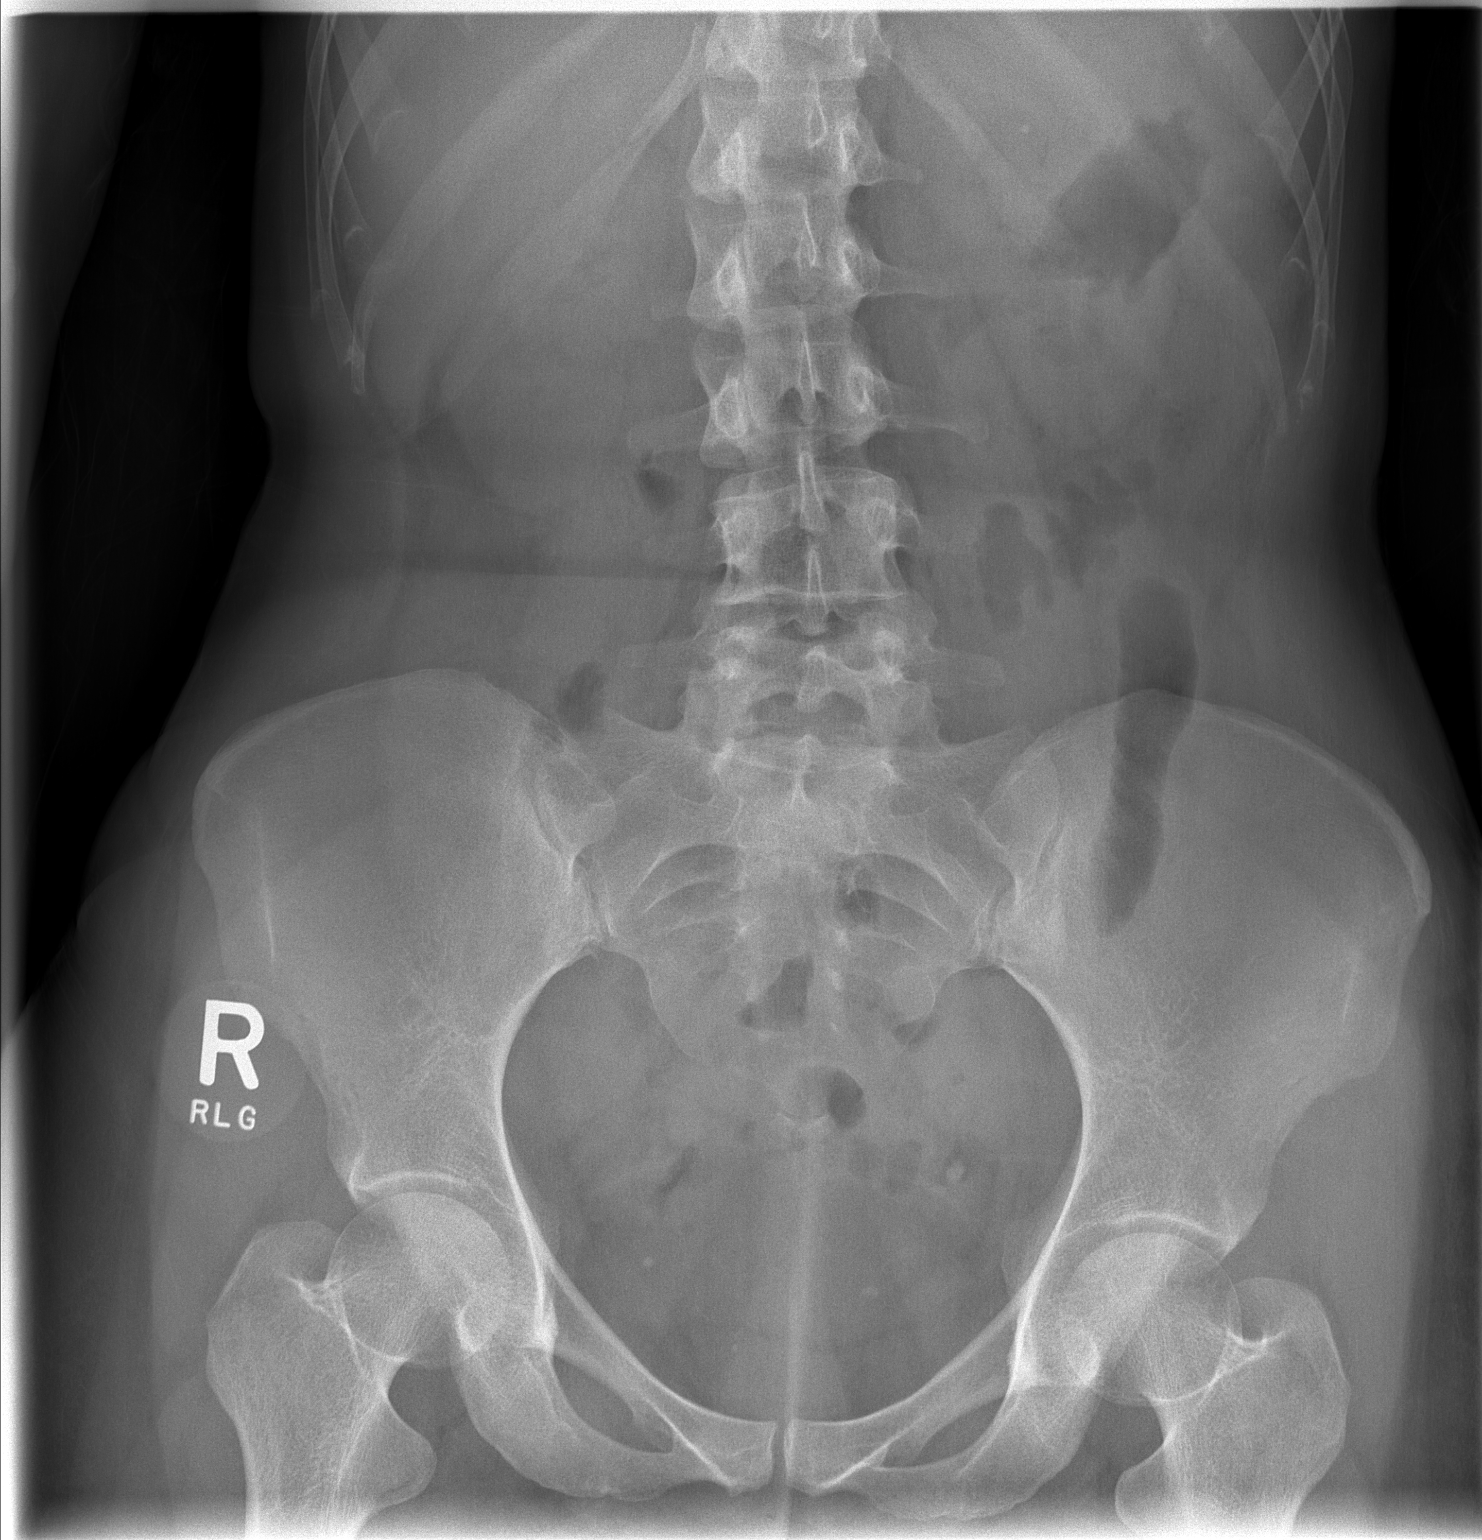

[1 of 1 positions shown; findings below may reference images not displayed]

FINDINGS: There is a 3 mm stone overlying the upper left kidney. No additional
radiopaque stones overlying the kidneys. There are 6 mm and 4 mm
calcifications in the left deep pelvis. No dilated small bowel
loops. No evidence of pneumatosis or pneumoperitoneum.
IMPRESSION: 1. Tiny upper left renal stone.
2. Two calcifications in the left deep pelvis, which could represent
venous phleboliths or distal left ureteral stones.

## 2019-06-08 ENCOUNTER — Encounter: Payer: BC Managed Care – PPO | Admitting: Internal Medicine

## 2019-06-11 ENCOUNTER — Other Ambulatory Visit: Payer: Self-pay

## 2019-06-11 ENCOUNTER — Ambulatory Visit (INDEPENDENT_AMBULATORY_CARE_PROVIDER_SITE_OTHER): Payer: BC Managed Care – PPO | Admitting: Internal Medicine

## 2019-06-11 ENCOUNTER — Encounter: Payer: Self-pay | Admitting: Internal Medicine

## 2019-06-11 VITALS — BP 123/76 | HR 82 | Temp 97.7°F | Resp 16 | Ht 66.0 in | Wt 137.2 lb

## 2019-06-11 DIAGNOSIS — Z0000 Encounter for general adult medical examination without abnormal findings: Secondary | ICD-10-CM

## 2019-06-11 DIAGNOSIS — F411 Generalized anxiety disorder: Secondary | ICD-10-CM

## 2019-06-11 DIAGNOSIS — D649 Anemia, unspecified: Secondary | ICD-10-CM | POA: Diagnosis not present

## 2019-06-11 DIAGNOSIS — Z79899 Other long term (current) drug therapy: Secondary | ICD-10-CM

## 2019-06-11 LAB — LIPID PANEL
Cholesterol: 207 mg/dL — ABNORMAL HIGH (ref 0–200)
HDL: 94.9 mg/dL (ref >39.00)
LDL Cholesterol: 84 mg/dL (ref 0–99)
NonHDL: 112.44
Total CHOL/HDL Ratio: 2
Triglycerides: 140 mg/dL (ref 0.0–149.0)
VLDL: 28 mg/dL (ref 0.0–40.0)

## 2019-06-11 LAB — CBC WITH DIFFERENTIAL/PLATELET
Basophils Absolute: 0.1 10*3/uL (ref 0.0–0.1)
Basophils Relative: 1 % (ref 0.0–3.0)
Eosinophils Absolute: 0.1 10*3/uL (ref 0.0–0.7)
Eosinophils Relative: 2.6 % (ref 0.0–5.0)
HCT: 34.9 % — ABNORMAL LOW (ref 36.0–46.0)
Hemoglobin: 11.5 g/dL — ABNORMAL LOW (ref 12.0–15.0)
Lymphocytes Relative: 30.5 % (ref 12.0–46.0)
Lymphs Abs: 1.7 10*3/uL (ref 0.7–4.0)
MCHC: 33 g/dL (ref 30.0–36.0)
MCV: 84.7 fl (ref 78.0–100.0)
Monocytes Absolute: 0.6 10*3/uL (ref 0.1–1.0)
Monocytes Relative: 10.8 % (ref 3.0–12.0)
Neutro Abs: 3.1 10*3/uL (ref 1.4–7.7)
Neutrophils Relative %: 55.1 % (ref 43.0–77.0)
Platelets: 243 10*3/uL (ref 150.0–400.0)
RBC: 4.12 Mil/uL (ref 3.87–5.11)
RDW: 13.5 % (ref 11.5–15.5)
WBC: 5.6 10*3/uL (ref 4.0–10.5)

## 2019-06-11 LAB — COMPREHENSIVE METABOLIC PANEL
ALT: 9 U/L (ref 0–35)
AST: 14 U/L (ref 0–37)
Albumin: 4.2 g/dL (ref 3.5–5.2)
Alkaline Phosphatase: 43 U/L (ref 39–117)
BUN: 16 mg/dL (ref 6–23)
CO2: 26 mEq/L (ref 19–32)
Calcium: 9.1 mg/dL (ref 8.4–10.5)
Chloride: 104 mEq/L (ref 96–112)
Creatinine, Ser: 1.01 mg/dL (ref 0.40–1.20)
GFR: 61.03 mL/min (ref >60.00)
Glucose, Bld: 98 mg/dL (ref 70–99)
Potassium: 4.4 mEq/L (ref 3.5–5.1)
Sodium: 135 mEq/L (ref 135–145)
Total Bilirubin: 0.4 mg/dL (ref 0.2–1.2)
Total Protein: 6.8 g/dL (ref 6.0–8.3)

## 2019-06-11 LAB — MAGNESIUM: Magnesium: 2.1 mg/dL (ref 1.5–2.5)

## 2019-06-11 LAB — TSH: TSH: 1.12 u[IU]/mL (ref 0.35–4.50)

## 2019-06-11 NOTE — Progress Notes (Signed)
Subjective:    Patient ID: Denise Roach, female    DOB: Jan 31, 1981, 39 y.o.   MRN: [id number]  DOS:  06/11/2019 Type of visit - description: CPX In general feeling well.  No major concerns. Ambulatory BPs when check normal   Review of Systems  A 14 point review of systems is negative    Past Medical History:  Diagnosis Date  . Anxiety   . B12 deficiency   . History of chicken pox   . History of kidney stones   . Hypertension    dx as at age 89  . Kidney stone   . Migraines   . Vitamin D deficiency     Past Surgical History:  Procedure Laterality Date  . EXTRACORPOREAL SHOCK WAVE LITHOTRIPSY Left 01/09/2018   Procedure: LEFT EXTRACORPOREAL SHOCK WAVE LITHOTRIPSY (ESWL);  Surgeon: Rene Paci, MD;  Location: [facility];  Service: Urology;  Laterality: Left;  . WISDOM TOOTH EXTRACTION      Allergies as of 06/11/2019   No Known Allergies     Medication List       Accurate as of June 11, 2019 11:59 PM. If you have any questions, ask your nurse or doctor.        acetaminophen 500 MG tablet Commonly known as: TYLENOL Take 1,000 mg by mouth every 6 (six) hours as needed.   ALPRAZolam 0.5 MG tablet Commonly known as: XANAX TAKE 1/2-1 TABLET BY MOUTH 3 TIMES DAILY AS NEEDED FOR ANXIETY   B-12 PO Take by mouth.   labetalol 200 MG tablet Commonly known as: NORMODYNE Take 0.5 tablets (100 mg total) by mouth 2 (two) times daily.   multivitamin tablet Take 1 tablet by mouth daily.   sertraline 25 MG tablet Commonly known as: ZOLOFT Take 25 mg by mouth daily.   SUMAtriptan 6 MG/0.5ML Soaj Inject 6 mg as directed every 2 (two) hours as needed. No more than 2 doses in 24 hour period.   SUMAtriptan 50 MG tablet Commonly known as: IMITREX TAKE 1-2 TABLETS (50-100 MG TOTAL) BY MOUTH EVERY 2 (TWO) HOURS AS NEEDED FOR MIGRAINE. MAY REPEAT IN 2 HOURS IF HEADACHE PERSISTS OR RECURS. NO MORE THAN 4 TABLETS IN A 24-HOUR.   thyroid 32.5 MG tablet Commonly  known as: ARMOUR Take 32.5 mg by mouth daily. What changed: Another medication with the same name was removed. Continue taking this medication, and follow the directions you see here. Changed by: Willow Ora, MD   VITAMIN D PO Take by mouth.        Family History  Problem Relation Age of Onset  . Stroke Paternal Grandmother   . Heart attack Paternal Grandmother   . Stroke Maternal Grandmother   . Diabetes Maternal Grandfather   . Heart disease Maternal Grandfather   . Hypertension Mother   . Colon cancer Neg Hx   . Breast cancer Neg Hx      Objective:   Physical Exam BP 123/76 (BP Location: Left Arm, Patient Position: Sitting, Cuff Size: Small)   Pulse 82   Temp 97.7 F (36.5 C) (Temporal)   Resp 16   Ht 5\' 6"  (1.676 m)   Wt 137 lb 4 oz (62.3 kg)   LMP 05/24/2019 (Exact Date)   SpO2 100%   BMI 22.15 kg/m  General: Well developed, NAD, BMI noted Neck: No  thyromegaly  HEENT:  Normocephalic . Face symmetric, atraumatic Lungs:  CTA B Normal respiratory effort, no intercostal retractions, no accessory muscle use.  Heart: RRR,  no murmur.  Abdomen:  Not distended, soft, non-tender. No rebound or rigidity.   Lower extremities: no pretibial edema bilaterally  Skin: Exposed areas without rash. Not pale. Not jaundice Neurologic:  alert & oriented X3.  Speech normal, gait appropriate for age and unassisted Strength symmetric and appropriate for age.  Psych: Cognition and judgment appear intact.  Cooperative with normal attention span and concentration.  Behavior appropriate. No anxious or depressed appearing.     Assessment      Assessment  HTN dx age 12 Anxiety-- xanax prn (at some point tried SSRIs, did not feel very well with them, prefers prn meds) H/o Migraines -- on imitrex  H/o urolithiasis , last 01/2018 Thyroid dz: f/u elsewhere ([facility]) Birth control: husband's vasectomy  PLAN Here for CPX HTN: Seems well controlled,  continue normal diet. Anxiety: Well-controlled, on sertraline and Xanax, UDS today Thyroid disease: Seen at a integr[facility] clinic, TSH was borderline elevated according to the patient, will follow up with them but I will go ahead and check a TSH. Needs letter of support to get a Covid vaccination due to history of HTN: done RTC 1 year    This visit occurred during the SARS-CoV-2 public health emergency.  Safety protocols were in place, including screening questions prior to the visit, additional usage of staff PPE, and extensive cleaning of exam room while observing appropriate contact time as indicated for disinfecting solutions.

## 2019-06-11 NOTE — Progress Notes (Signed)
Pre visit review using our clinic review tool, if applicable. No additional management support is needed unless otherwise documented below in the visit note. 

## 2019-06-11 NOTE — Patient Instructions (Signed)
GO TO THE LAB : Get the blood work     GO TO THE FRONT DESK Come back for a physical in 1 year, please make an appointment   Continued checking your blood pressure twice a month BP GOAL is between 110/65 and  135/85. If it is consistently higher or lower, let me know

## 2019-06-12 NOTE — Assessment & Plan Note (Signed)
Here for CPX HTN: Seems well controlled, continue normal diet. Anxiety: Well-controlled, on sertraline and Xanax, UDS today Thyroid disease: Seen at a integrative medicine clinic, TSH was borderline elevated according to the patient, will follow up with them but I will go ahead and check a TSH. Needs letter of support to get a Covid vaccination due to history of HTN: done RTC 1 year

## 2019-06-12 NOTE — Assessment & Plan Note (Signed)
Tdap 07-2011 Had a flu shot Gyn per Dr Vickey Sages Labs: CMP, FLP, CBC, TSH, UDS Cont w/ healthy lifestyle, very active, yoga, tennis.  Diet is  healthy.

## 2019-06-13 LAB — PAIN MGMT, PROFILE 8 W/CONF, U
6 Acetylmorphine: NEGATIVE ng/mL
Alcohol Metabolites: NEGATIVE ng/mL (ref <500)
Alphahydroxyalprazolam: 26 ng/mL
Alphahydroxymidazolam: NEGATIVE ng/mL
Alphahydroxytriazolam: NEGATIVE ng/mL
Aminoclonazepam: NEGATIVE ng/mL
Amphetamines: NEGATIVE ng/mL
Benzodiazepines: POSITIVE ng/mL
Buprenorphine, Urine: NEGATIVE ng/mL
Cocaine Metabolite: NEGATIVE ng/mL
Creatinine: 35.6 mg/dL
Hydroxyethylflurazepam: NEGATIVE ng/mL
Lorazepam: NEGATIVE ng/mL
MDMA: NEGATIVE ng/mL
Marijuana Metabolite: NEGATIVE ng/mL
Nordiazepam: NEGATIVE ng/mL
Opiates: NEGATIVE ng/mL
Oxazepam: NEGATIVE ng/mL
Oxidant: NEGATIVE ug/mL
Oxycodone: NEGATIVE ng/mL
Temazepam: NEGATIVE ng/mL
pH: 6.8 (ref 4.5–9.0)

## 2019-06-15 ENCOUNTER — Other Ambulatory Visit: Payer: BC Managed Care – PPO

## 2019-06-15 ENCOUNTER — Telehealth: Payer: Self-pay

## 2019-06-15 DIAGNOSIS — D649 Anemia, unspecified: Secondary | ICD-10-CM

## 2019-06-15 NOTE — Telephone Encounter (Signed)
Add on request has been faxed to [facility] to see if they can send it to [facility] for testing.

## 2019-06-15 NOTE — Addendum Note (Signed)
Addended by: Mervin Kung A on: 06/15/2019 03:36 PM   Modules accepted: Orders

## 2019-06-15 NOTE — Telephone Encounter (Signed)
-----   Message from Wanda Plump, MD sent at 06/15/2019 12:58 PM EST ----- Regarding: Could we add iron, ferritin?  DX anemia

## 2019-06-15 NOTE — Telephone Encounter (Signed)
Patient had cpe on 06/11/2019 can we add this lab or is it too late?

## 2019-06-16 LAB — IRON: Iron: 76 ug/dL (ref 40–190)

## 2019-06-16 LAB — FERRITIN: Ferritin: 5 ng/mL — ABNORMAL LOW (ref 16–154)

## 2019-06-18 MED ORDER — IRON 325 (65 FE) MG PO TABS: 1.0000 | ORAL_TABLET | Freq: Two times a day (BID) | ORAL | 6 refills | Status: DC

## 2019-06-18 NOTE — Addendum Note (Signed)
Addended byConrad Cumberland D on: 06/18/2019 10:09 AM   Modules accepted: Orders

## 2019-08-30 DIAGNOSIS — R5383 Other fatigue: Secondary | ICD-10-CM | POA: Diagnosis not present

## 2019-08-30 DIAGNOSIS — I10 Essential (primary) hypertension: Secondary | ICD-10-CM | POA: Diagnosis not present

## 2019-08-30 DIAGNOSIS — E559 Vitamin D deficiency, unspecified: Secondary | ICD-10-CM | POA: Diagnosis not present

## 2019-08-30 DIAGNOSIS — E039 Hypothyroidism, unspecified: Secondary | ICD-10-CM | POA: Diagnosis not present

## 2019-08-30 DIAGNOSIS — E538 Deficiency of other specified B group vitamins: Secondary | ICD-10-CM | POA: Diagnosis not present

## 2019-09-08 ENCOUNTER — Other Ambulatory Visit: Payer: Self-pay | Admitting: Internal Medicine

## 2019-09-12 DIAGNOSIS — E538 Deficiency of other specified B group vitamins: Secondary | ICD-10-CM | POA: Diagnosis not present

## 2019-09-12 DIAGNOSIS — E039 Hypothyroidism, unspecified: Secondary | ICD-10-CM | POA: Diagnosis not present

## 2019-09-12 DIAGNOSIS — R5383 Other fatigue: Secondary | ICD-10-CM | POA: Diagnosis not present

## 2019-09-12 DIAGNOSIS — G43909 Migraine, unspecified, not intractable, without status migrainosus: Secondary | ICD-10-CM | POA: Diagnosis not present

## 2019-10-25 ENCOUNTER — Telehealth: Payer: Self-pay | Admitting: Internal Medicine

## 2019-10-25 NOTE — Telephone Encounter (Signed)
Alprazolam refill.   Last OV: 06/11/2019 Last Fill: 04/26/2019 #60 and 2RF Pt sig: 1/2 to 1 tab tid prn UDS: 06/11/2019 Low risk

## 2019-10-25 NOTE — Telephone Encounter (Signed)
Sent!

## 2019-12-17 ENCOUNTER — Other Ambulatory Visit: Payer: Self-pay | Admitting: Internal Medicine

## 2019-12-22 ENCOUNTER — Other Ambulatory Visit: Payer: Self-pay | Admitting: Internal Medicine

## 2020-02-20 ENCOUNTER — Encounter: Payer: Self-pay | Admitting: Internal Medicine

## 2020-03-11 DIAGNOSIS — E538 Deficiency of other specified B group vitamins: Secondary | ICD-10-CM | POA: Diagnosis not present

## 2020-03-11 DIAGNOSIS — I10 Essential (primary) hypertension: Secondary | ICD-10-CM | POA: Diagnosis not present

## 2020-03-11 DIAGNOSIS — G43909 Migraine, unspecified, not intractable, without status migrainosus: Secondary | ICD-10-CM | POA: Diagnosis not present

## 2020-03-11 DIAGNOSIS — E039 Hypothyroidism, unspecified: Secondary | ICD-10-CM | POA: Diagnosis not present

## 2020-04-18 DIAGNOSIS — I10 Essential (primary) hypertension: Secondary | ICD-10-CM | POA: Diagnosis not present

## 2020-04-18 DIAGNOSIS — R5383 Other fatigue: Secondary | ICD-10-CM | POA: Diagnosis not present

## 2020-04-18 DIAGNOSIS — E538 Deficiency of other specified B group vitamins: Secondary | ICD-10-CM | POA: Diagnosis not present

## 2020-04-18 DIAGNOSIS — G43909 Migraine, unspecified, not intractable, without status migrainosus: Secondary | ICD-10-CM | POA: Diagnosis not present

## 2020-04-28 ENCOUNTER — Other Ambulatory Visit: Payer: Self-pay | Admitting: Internal Medicine

## 2020-04-28 NOTE — Telephone Encounter (Signed)
E-scribing down. Rx printed and faxed to [facility].

## 2020-05-14 DIAGNOSIS — Z01419 Encounter for gynecological examination (general) (routine) without abnormal findings: Secondary | ICD-10-CM | POA: Diagnosis not present

## 2020-05-14 DIAGNOSIS — Z6822 Body mass index (BMI) 22.0-22.9, adult: Secondary | ICD-10-CM | POA: Diagnosis not present

## 2020-06-11 ENCOUNTER — Encounter: Payer: BC Managed Care – PPO | Admitting: Internal Medicine

## 2020-06-16 ENCOUNTER — Encounter: Payer: Self-pay | Admitting: Internal Medicine

## 2020-06-16 ENCOUNTER — Ambulatory Visit (INDEPENDENT_AMBULATORY_CARE_PROVIDER_SITE_OTHER): Payer: BC Managed Care – PPO | Admitting: Internal Medicine

## 2020-06-16 ENCOUNTER — Other Ambulatory Visit: Payer: Self-pay

## 2020-06-16 VITALS — BP 134/80 | HR 88 | Temp 98.3°F | Resp 16 | Ht 66.0 in | Wt 140.1 lb

## 2020-06-16 DIAGNOSIS — I10 Essential (primary) hypertension: Secondary | ICD-10-CM

## 2020-06-16 DIAGNOSIS — Z0000 Encounter for general adult medical examination without abnormal findings: Secondary | ICD-10-CM | POA: Diagnosis not present

## 2020-06-16 DIAGNOSIS — E538 Deficiency of other specified B group vitamins: Secondary | ICD-10-CM

## 2020-06-16 DIAGNOSIS — Z1159 Encounter for screening for other viral diseases: Secondary | ICD-10-CM

## 2020-06-16 DIAGNOSIS — D649 Anemia, unspecified: Secondary | ICD-10-CM

## 2020-06-16 DIAGNOSIS — Z79899 Other long term (current) drug therapy: Secondary | ICD-10-CM | POA: Diagnosis not present

## 2020-06-16 DIAGNOSIS — F411 Generalized anxiety disorder: Secondary | ICD-10-CM

## 2020-06-16 LAB — CBC WITH DIFFERENTIAL/PLATELET
Basophils Absolute: 0.1 10*3/uL (ref 0.0–0.1)
Basophils Relative: 1.3 % (ref 0.0–3.0)
Eosinophils Absolute: 0.1 10*3/uL (ref 0.0–0.7)
Eosinophils Relative: 2.3 % (ref 0.0–5.0)
HCT: 40.6 % (ref 36.0–46.0)
Hemoglobin: 13.7 g/dL (ref 12.0–15.0)
Lymphocytes Relative: 30.8 % (ref 12.0–46.0)
Lymphs Abs: 1.3 10*3/uL (ref 0.7–4.0)
MCHC: 33.7 g/dL (ref 30.0–36.0)
MCV: 88.9 fl (ref 78.0–100.0)
Monocytes Absolute: 0.4 10*3/uL (ref 0.1–1.0)
Monocytes Relative: 10.2 % (ref 3.0–12.0)
Neutro Abs: 2.4 10*3/uL (ref 1.4–7.7)
Neutrophils Relative %: 55.4 % (ref 43.0–77.0)
Platelets: 225 10*3/uL (ref 150.0–400.0)
RBC: 4.56 Mil/uL (ref 3.87–5.11)
RDW: 13.2 % (ref 11.5–15.5)
WBC: 4.3 10*3/uL (ref 4.0–10.5)

## 2020-06-16 LAB — LIPID PANEL
Cholesterol: 225 mg/dL — ABNORMAL HIGH (ref 0–200)
HDL: 91.9 mg/dL (ref >39.00)
LDL Cholesterol: 114 mg/dL — ABNORMAL HIGH (ref 0–99)
NonHDL: 133.35
Total CHOL/HDL Ratio: 2
Triglycerides: 99 mg/dL (ref 0.0–149.0)
VLDL: 19.8 mg/dL (ref 0.0–40.0)

## 2020-06-16 LAB — COMPREHENSIVE METABOLIC PANEL
ALT: 12 U/L (ref 0–35)
AST: 16 U/L (ref 0–37)
Albumin: 4.3 g/dL (ref 3.5–5.2)
Alkaline Phosphatase: 45 U/L (ref 39–117)
BUN: 11 mg/dL (ref 6–23)
CO2: 28 mEq/L (ref 19–32)
Calcium: 9.3 mg/dL (ref 8.4–10.5)
Chloride: 102 mEq/L (ref 96–112)
Creatinine, Ser: 0.97 mg/dL (ref 0.40–1.20)
GFR: 73.37 mL/min (ref >60.00)
Glucose, Bld: 86 mg/dL (ref 70–99)
Potassium: 4.2 mEq/L (ref 3.5–5.1)
Sodium: 137 mEq/L (ref 135–145)
Total Bilirubin: 0.8 mg/dL (ref 0.2–1.2)
Total Protein: 6.8 g/dL (ref 6.0–8.3)

## 2020-06-16 LAB — VITAMIN B12: Vitamin B-12: 669 pg/mL (ref 211–911)

## 2020-06-16 LAB — FOLATE: Folate: >23.6 ng/mL (ref >5.9)

## 2020-06-16 LAB — FERRITIN: Ferritin: 13.7 ng/mL (ref 10.0–291.0)

## 2020-06-16 LAB — IRON: Iron: 228 ug/dL — ABNORMAL HIGH (ref 42–145)

## 2020-06-16 NOTE — Patient Instructions (Signed)
Check the  blood pressure regularly at home BP GOAL is between 110/65 and  135/85. If it is consistently higher or lower, let me know  Check your thyroid with Dr  Renaldo Fiddler  in few weeks and again in few months   GO TO THE LAB : Get the blood work     GO TO THE FRONT DESK, PLEASE SCHEDULE YOUR APPOINTMENTS Come back for a physical exam in 1 year

## 2020-06-16 NOTE — Progress Notes (Signed)
Subjective:    Patient ID: Denise Roach, female    DOB: 06-Aug-1980, 40 y.o.   MRN: [id number]  DOS:  06/16/2020 Type of visit - description: CPX  Since the last office visit is doing well. She wean herself off of thyroid supplements.   Review of Systems  Other than above, a 14 point review of systems is negative      Past Medical History:  Diagnosis Date   Anxiety    B12 deficiency    History of chicken pox    History of kidney stones    Hypertension    dx as at age 70   Kidney stone    Migraines    Vitamin D deficiency     Past Surgical History:  Procedure Laterality Date   EXTRACORPOREAL SHOCK WAVE LITHOTRIPSY Left 01/09/2018   Procedure: LEFT EXTRACORPOREAL SHOCK WAVE LITHOTRIPSY (ESWL);  Surgeon: Rene Paci, MD;  Location: [facility];  Service: Urology;  Laterality: Left;   WISDOM TOOTH EXTRACTION      Allergies as of 06/16/2020   No Known Allergies     Medication List       Accurate as of June 16, 2020 11:59 PM. If you have any questions, ask your nurse or doctor.        STOP taking these medications   Iron 325 (65 Fe) MG Tabs Stopped by: Willow Ora, MD   thyroid 32.5 MG tablet Commonly known as: ARMOUR Stopped by: Willow Ora, MD     TAKE these medications   acetaminophen 500 MG tablet Commonly known as: TYLENOL Take 1,000 mg by mouth every 6 (six) hours as needed.   ALPRAZolam 0.5 MG tablet Commonly known as: XANAX TAKE 1/2-1 TABLET BY MOUTH 3 TIMES DAILY AS NEEDED FOR ANXIETY   B-12 PO Take by mouth.   labetalol 200 MG tablet Commonly known as: NORMODYNE Take 0.5 tablets (100 mg total) by mouth 2 (two) times daily.   multivitamin tablet Take 1 tablet by mouth daily.   sertraline 25 MG tablet Commonly known as: ZOLOFT Take 25 mg by mouth daily.   SUMAtriptan 50 MG tablet Commonly known as: IMITREX Take 1-2 tablets (50-100 mg total) by mouth every 2 (two) hours as needed for migraine. May repeat in 2 hours if  headache persists or recurs.  No more than 4 tablets in a 24-hour.   SUMAtriptan 6 MG/0.5ML Soaj Inject 6 mg as directed every 2 (two) hours as needed. No more than 2 doses in 24 hour period.   VITAMIN D PO Take by mouth.          Objective:   Physical Exam BP 134/80 (BP Location: Left Arm, Patient Position: Sitting, Cuff Size: Small)    Pulse 88    Temp 98.3 F (36.8 C) (Oral)    Resp 16    Ht 5\' 6"  (1.676 m)    Wt 140 lb 2 oz (63.6 kg)    LMP 05/24/2020 (Exact Date)    SpO2 98%    BMI 22.62 kg/m  General: Well developed, NAD, BMI noted Neck: No  thyromegaly  HEENT:  Normocephalic . Face symmetric, atraumatic Lungs:  CTA B Normal respiratory effort, no intercostal retractions, no accessory muscle use. Heart: RRR,  no murmur.  Abdomen:  Not distended, soft, non-tender. No rebound or rigidity.   Lower extremities: no pretibial edema bilaterally  Skin: Exposed areas without rash. Not pale. Not jaundice Neurologic:  alert & oriented X3.  Speech normal, gait appropriate  for age and unassisted Strength symmetric and appropriate for age.  Psych: Cognition and judgment appear intact.  Cooperative with normal attention span and concentration.  Behavior appropriate. No anxious or depressed appearing.     Assessment     Assessment  HTN dx age 45 Anxiety-- xanax prn  H/o Migraines -- on imitrex  H/o urolithiasis , last 01/2018 Thyroid dz: f/u elsewhere  Birth control: husband's vasectomy  PLAN Here for CPX HTN: On beta-blockers, BP today is great, recommend ambulatory BPs. Anxiety: Since the last visit, is now on sertraline, low-dose Xanax on averages a pill  take 2-3 times a month.  Contract and UDS today. Thyroid disease: Used to be follow elsewhere, she wean herself off thyroid Armour is approximately 4 weeks ago, gynecology plans to check a TSH in few weeks, recommend to also check in few months. B12 deficiency?:  Checking labs, on oral supplements. Iron deficiency?   Few months ago I got reports from another clinic showing a low iron and ferritin, I rec iron supplements, she could not tolerate them. Subsequently labs were checked elsewhere and they came back normal.  Checking labs. RTC 1 year  This visit occurred during the SARS-CoV-2 public health emergency.  Safety protocols were in place, including screening questions prior to the visit, additional usage of staff PPE, and extensive cleaning of exam room while observing appropriate contact time as indicated for disinfecting solutions.

## 2020-06-17 ENCOUNTER — Encounter: Payer: Self-pay | Admitting: Internal Medicine

## 2020-06-17 LAB — DRUG MONITORING, PANEL 8 WITH CONFIRMATION, URINE
6 Acetylmorphine: NEGATIVE ng/mL (ref <10)
Alcohol Metabolites: NEGATIVE ng/mL
Amphetamines: NEGATIVE ng/mL (ref <500)
Benzodiazepines: NEGATIVE ng/mL (ref <100)
Buprenorphine, Urine: NEGATIVE ng/mL (ref <5)
Cocaine Metabolite: NEGATIVE ng/mL (ref <150)
Creatinine: 42.8 mg/dL
MDMA: NEGATIVE ng/mL (ref <500)
Marijuana Metabolite: NEGATIVE ng/mL (ref <20)
Opiates: NEGATIVE ng/mL (ref <100)
Oxidant: NEGATIVE ug/mL
Oxycodone: NEGATIVE ng/mL (ref <100)
pH: 6.9 (ref 4.5–9.0)

## 2020-06-17 LAB — HEPATITIS C ANTIBODY
Hepatitis C Ab: NONREACTIVE
SIGNAL TO CUT-OFF: 0.01 (ref <1.00)

## 2020-06-17 LAB — DM TEMPLATE

## 2020-06-17 NOTE — Assessment & Plan Note (Signed)
Here for CPX HTN: On beta-blockers, BP today is great, recommend ambulatory BPs. Anxiety: Since the last visit, is now on sertraline, low-dose Xanax on averages a pill  take 2-3 times a month.  Contract and UDS today. Thyroid disease: Used to be follow elsewhere, she wean herself off thyroid Armour is approximately 4 weeks ago, gynecology plans to check a TSH in few weeks, recommend to also check in few months. B12 deficiency?:  Checking labs, on oral supplements. Iron deficiency?  Few months ago I got reports from another clinic showing a low iron and ferritin, I rec iron supplements, she could not tolerate them. Subsequently labs were checked elsewhere and they came back normal.  Checking labs. RTC 1 year

## 2020-06-17 NOTE — Assessment & Plan Note (Signed)
  Tdap 07-2011 Covid VAX x3 Had a flu shot Female care: Dr. Renaldo Fiddler Labs: CMP, FLP, CBC, iron, ferritin, hep C, UDS, B12, folic acid Diet exercise: Cont w/ healthy lifestyle, very active, yoga, tennis.  Diet is  healthy.

## 2020-07-02 DIAGNOSIS — F419 Anxiety disorder, unspecified: Secondary | ICD-10-CM | POA: Diagnosis not present

## 2020-07-02 DIAGNOSIS — E039 Hypothyroidism, unspecified: Secondary | ICD-10-CM | POA: Diagnosis not present

## 2020-08-12 ENCOUNTER — Other Ambulatory Visit: Payer: Self-pay | Admitting: Internal Medicine

## 2020-10-20 DIAGNOSIS — E039 Hypothyroidism, unspecified: Secondary | ICD-10-CM | POA: Diagnosis not present

## 2020-10-22 ENCOUNTER — Telehealth: Payer: Self-pay | Admitting: Internal Medicine

## 2020-10-23 NOTE — Telephone Encounter (Signed)
PDMP okay, prescription sent 

## 2020-10-23 NOTE — Telephone Encounter (Signed)
Requesting: alprazolam 0.5mg  Contract: 06/16/2020 UDS: 06/16/2020 Last Visit: 06/16/2020 Next Visit: 06/17/2021 Last Refill: 10/25/2019 #60 and 1RF Pt sig: 1/2 to 1 tab tid prn   Please Advise

## 2020-12-01 ENCOUNTER — Other Ambulatory Visit: Payer: Self-pay | Admitting: Internal Medicine

## 2021-01-29 DIAGNOSIS — M9905 Segmental and somatic dysfunction of pelvic region: Secondary | ICD-10-CM | POA: Diagnosis not present

## 2021-01-29 DIAGNOSIS — M9904 Segmental and somatic dysfunction of sacral region: Secondary | ICD-10-CM | POA: Diagnosis not present

## 2021-01-29 DIAGNOSIS — M7918 Myalgia, other site: Secondary | ICD-10-CM | POA: Diagnosis not present

## 2021-01-29 DIAGNOSIS — M9903 Segmental and somatic dysfunction of lumbar region: Secondary | ICD-10-CM | POA: Diagnosis not present

## 2021-01-29 DIAGNOSIS — M62830 Muscle spasm of back: Secondary | ICD-10-CM | POA: Diagnosis not present

## 2021-01-30 DIAGNOSIS — M25652 Stiffness of left hip, not elsewhere classified: Secondary | ICD-10-CM | POA: Diagnosis not present

## 2021-01-30 DIAGNOSIS — M545 Low back pain, unspecified: Secondary | ICD-10-CM | POA: Diagnosis not present

## 2021-01-30 DIAGNOSIS — M9904 Segmental and somatic dysfunction of sacral region: Secondary | ICD-10-CM | POA: Diagnosis not present

## 2021-01-30 DIAGNOSIS — M25651 Stiffness of right hip, not elsewhere classified: Secondary | ICD-10-CM | POA: Diagnosis not present

## 2021-01-30 DIAGNOSIS — M7918 Myalgia, other site: Secondary | ICD-10-CM | POA: Diagnosis not present

## 2021-01-30 DIAGNOSIS — M9905 Segmental and somatic dysfunction of pelvic region: Secondary | ICD-10-CM | POA: Diagnosis not present

## 2021-01-30 DIAGNOSIS — M9903 Segmental and somatic dysfunction of lumbar region: Secondary | ICD-10-CM | POA: Diagnosis not present

## 2021-02-02 DIAGNOSIS — M9903 Segmental and somatic dysfunction of lumbar region: Secondary | ICD-10-CM | POA: Diagnosis not present

## 2021-02-02 DIAGNOSIS — M9904 Segmental and somatic dysfunction of sacral region: Secondary | ICD-10-CM | POA: Diagnosis not present

## 2021-02-02 DIAGNOSIS — M25652 Stiffness of left hip, not elsewhere classified: Secondary | ICD-10-CM | POA: Diagnosis not present

## 2021-02-02 DIAGNOSIS — M7918 Myalgia, other site: Secondary | ICD-10-CM | POA: Diagnosis not present

## 2021-02-02 DIAGNOSIS — M25651 Stiffness of right hip, not elsewhere classified: Secondary | ICD-10-CM | POA: Diagnosis not present

## 2021-02-02 DIAGNOSIS — M9905 Segmental and somatic dysfunction of pelvic region: Secondary | ICD-10-CM | POA: Diagnosis not present

## 2021-02-02 DIAGNOSIS — M545 Low back pain, unspecified: Secondary | ICD-10-CM | POA: Diagnosis not present

## 2021-02-18 ENCOUNTER — Other Ambulatory Visit: Payer: Self-pay | Admitting: Internal Medicine

## 2021-03-12 ENCOUNTER — Other Ambulatory Visit: Payer: Self-pay | Admitting: Internal Medicine

## 2021-05-14 ENCOUNTER — Encounter: Payer: Self-pay | Admitting: Internal Medicine

## 2021-05-31 ENCOUNTER — Telehealth: Payer: Self-pay | Admitting: Internal Medicine

## 2021-06-01 ENCOUNTER — Other Ambulatory Visit: Payer: Self-pay | Admitting: Internal Medicine

## 2021-06-01 NOTE — Telephone Encounter (Signed)
PDMP okay, Rx sent 

## 2021-06-01 NOTE — Telephone Encounter (Signed)
Requesting: alprazolam 0.5mg   Contract: 06/16/2020 UDS: 06/16/2020 Last Visit: 06/16/2020 Next Visit: 06/17/2021 Last Refill: 10/23/2020 #60 and 2RF  Please Advise

## 2021-06-17 ENCOUNTER — Encounter: Payer: BC Managed Care – PPO | Admitting: Internal Medicine

## 2021-07-01 ENCOUNTER — Encounter: Payer: Self-pay | Admitting: Internal Medicine

## 2021-07-01 ENCOUNTER — Ambulatory Visit (INDEPENDENT_AMBULATORY_CARE_PROVIDER_SITE_OTHER): Payer: BC Managed Care – PPO | Admitting: Internal Medicine

## 2021-07-01 VITALS — BP 116/68 | HR 67 | Temp 98.3°F | Resp 16 | Ht 66.0 in | Wt 143.2 lb

## 2021-07-01 DIAGNOSIS — Z79899 Other long term (current) drug therapy: Secondary | ICD-10-CM | POA: Diagnosis not present

## 2021-07-01 DIAGNOSIS — Z0000 Encounter for general adult medical examination without abnormal findings: Secondary | ICD-10-CM

## 2021-07-01 DIAGNOSIS — F411 Generalized anxiety disorder: Secondary | ICD-10-CM

## 2021-07-01 LAB — LIPID PANEL
Cholesterol: 218 mg/dL — ABNORMAL HIGH (ref 0–200)
HDL: 86.7 mg/dL (ref >39.00)
LDL Cholesterol: 104 mg/dL — ABNORMAL HIGH (ref 0–99)
NonHDL: 130.89
Total CHOL/HDL Ratio: 3
Triglycerides: 135 mg/dL (ref 0.0–149.0)
VLDL: 27 mg/dL (ref 0.0–40.0)

## 2021-07-01 LAB — CBC WITH DIFFERENTIAL/PLATELET
Basophils Absolute: 0.1 10*3/uL (ref 0.0–0.1)
Basophils Relative: 1.1 % (ref 0.0–3.0)
Eosinophils Absolute: 0.1 10*3/uL (ref 0.0–0.7)
Eosinophils Relative: 2.1 % (ref 0.0–5.0)
HCT: 40.4 % (ref 36.0–46.0)
Hemoglobin: 13.1 g/dL (ref 12.0–15.0)
Lymphocytes Relative: 26 % (ref 12.0–46.0)
Lymphs Abs: 1.4 10*3/uL (ref 0.7–4.0)
MCHC: 32.3 g/dL (ref 30.0–36.0)
MCV: 92.5 fl (ref 78.0–100.0)
Monocytes Absolute: 0.5 10*3/uL (ref 0.1–1.0)
Monocytes Relative: 9.6 % (ref 3.0–12.0)
Neutro Abs: 3.4 10*3/uL (ref 1.4–7.7)
Neutrophils Relative %: 61.2 % (ref 43.0–77.0)
Platelets: 231 10*3/uL (ref 150.0–400.0)
RBC: 4.37 Mil/uL (ref 3.87–5.11)
RDW: 13.5 % (ref 11.5–15.5)
WBC: 5.5 10*3/uL (ref 4.0–10.5)

## 2021-07-01 LAB — COMPREHENSIVE METABOLIC PANEL
ALT: 10 U/L (ref 0–35)
AST: 17 U/L (ref 0–37)
Albumin: 4.4 g/dL (ref 3.5–5.2)
Alkaline Phosphatase: 48 U/L (ref 39–117)
BUN: 21 mg/dL (ref 6–23)
CO2: 28 mEq/L (ref 19–32)
Calcium: 9.5 mg/dL (ref 8.4–10.5)
Chloride: 102 mEq/L (ref 96–112)
Creatinine, Ser: 0.93 mg/dL (ref 0.40–1.20)
GFR: 76.61 mL/min (ref >60.00)
Glucose, Bld: 98 mg/dL (ref 70–99)
Potassium: 4.1 mEq/L (ref 3.5–5.1)
Sodium: 138 mEq/L (ref 135–145)
Total Bilirubin: 0.5 mg/dL (ref 0.2–1.2)
Total Protein: 6.7 g/dL (ref 6.0–8.3)

## 2021-07-01 LAB — TSH: TSH: 2.62 u[IU]/mL (ref 0.35–5.50)

## 2021-07-01 NOTE — Patient Instructions (Signed)
Check the  blood pressure regularly °BP GOAL is between 110/65 and  135/85. °If it is consistently higher or lower, let me know °  ° °GO TO THE LAB : Get the blood work   ° ° °GO TO THE FRONT DESK, PLEASE SCHEDULE YOUR APPOINTMENTS °Come back for   a physical exam in 1 year °

## 2021-07-01 NOTE — Progress Notes (Signed)
? ?Subjective:  ? ? Patient ID: Denise Roach, female    DOB: 1980/09/28, 41 y.o.   MRN: [id number] ? ?DOS:  07/01/2021 ?Type of visit - description: cpx ? ?Since the last office visit is doing well. ?Has some anxiety. ?Symptoms are currently well controlled ? ?Review of Systems ? ?Other than above, a 14 point review of systems is negative  ? ?  ? ?Past Medical History:  ?Diagnosis Date  ? Anxiety   ? B12 deficiency   ? History of chicken pox   ? History of kidney stones   ? Hypertension   ? dx as at age 72  ? Kidney stone   ? Migraines   ? Vitamin D deficiency   ? ? ?Past Surgical History:  ?Procedure Laterality Date  ? EXTRACORPOREAL SHOCK WAVE LITHOTRIPSY Left 01/09/2018  ? Procedure: LEFT EXTRACORPOREAL SHOCK WAVE LITHOTRIPSY (ESWL);  Surgeon: Rene Paci, MD;  Location: [facility];  Service: Urology;  Laterality: Left;  ? WISDOM TOOTH EXTRACTION    ? ?Social History  ? ?Socioeconomic History  ? Marital status: Married  ?  Spouse name: Not on file  ? Number of children: 3  ? Years of education: Not on file  ? Highest education level: Not on file  ?Occupational History  ? Occupation: Advertising account executive (part time)  ?  Employer: [employer]  ?Tobacco Use  ? Smoking status: Never  ? Smokeless tobacco: Never  ?Vaping Use  ? Vaping Use: Never used  ?Substance and Sexual Activity  ? Alcohol use: Yes  ?  Comment: rarely  ? Drug use: No  ? Sexual activity: Yes  ?Other Topics Concern  ? Not on file  ?Social History Narrative  ? 3 children:   ? 2013, boy  ? 2010  ? 2008  ? ?Social Determinants of Health  ? ?Financial Resource Strain: Not on file  ?Food Insecurity: Not on file  ?Transportation Needs: Not on file  ?Physical Activity: Not on file  ?Stress: Not on file  ?Social Connections: Not on file  ?Intimate Partner Violence: Not on file  ? ? ?Current Outpatient Medications  ?Medication Instructions  ? acetaminophen (TYLENOL) 1,000 mg, Every 6 hours PRN  ? ALPRAZolam (XANAX) 0.5 MG tablet TAKE 1/2-1 TABLET  BY MOUTH 3 TIMES DAILY AS NEEDED FOR ANXIETY  ? Cyanocobalamin (B-12 PO) Oral  ? labetalol (NORMODYNE) 100 mg, Oral, 2 times daily  ? Multiple Vitamin (MULTIVITAMIN) tablet 1 tablet, Daily  ? sertraline (ZOLOFT) 25 mg, Oral, Daily  ? SUMAtriptan (IMITREX) 50 MG tablet TAKE 1-2 TABLETS BY MOUTH EVERY 2 HOURS AS NEEDED FOR MIGRAINE. MAY REPEAT IN 2 HOURS IF HEADACHE PERSISTS OR RECURS. NO MORE THAN 4 TABLETS IN A 24-HOUR.  ? SUMAtriptan 6 MG/0.5ML SOAJ INJECY 0.5MG  AS INSTRUCTED EVERY 2 HOURS AS NEEDED.MAX 2 DOSES/24 HRS  ? VITAMIN D PO Oral  ? ? ?   ?Objective:  ? Physical Exam ?BP 116/68 (BP Location: Left Arm, Patient Position: Sitting, Cuff Size: Small)   Pulse 67   Temp 98.3 ?F (36.8 ?C) (Oral)   Resp 16   Ht 5\' 6"  (1.676 m)   Wt 143 lb 4 oz (65 kg)   LMP 06/19/2021 (Exact Date)   SpO2 98%   BMI 23.12 kg/m?  ?General: ?Well developed, NAD, BMI noted ?Neck: No  thyromegaly  ?HEENT:  ?Normocephalic . Face symmetric, atraumatic ?Lungs:  ?CTA B ?Normal respiratory effort, no intercostal retractions, no accessory muscle use. ?Heart: RRR,  no murmur.  ?  Abdomen:  ?Not distended, soft, non-tender. No rebound or rigidity.   ?Lower extremities: no pretibial edema bilaterally  ?Skin: Exposed areas without rash. Not pale. Not jaundice ?Neurologic:  ?alert & oriented X3.  ?Speech normal, gait appropriate for age and unassisted ?Strength symmetric and appropriate for age.  ?Psych: ?Cognition and judgment appear intact.  ?Cooperative with normal attention span and concentration.  ?Behavior appropriate. ?No anxious or depressed appearing. ? ?   ?Assessment   ? ?Assessment  ?HTN dx age 51 ?Anxiety-- xanax prn  ?H/o Migraines -- on imitrex  ?H/o urolithiasis , last 01/2018 ?Birth control: husband's vasectomy ? ?PLAN ?Here for CPX ?HTN: On beta-blockers, BP is great ?Anxiety: On sertraline and Xanax as needed, check UDS ?History of migraines: At baseline.  Occasional symptoms. ?RTC 1 year ? ? ? ? ?This visit occurred during  the SARS-CoV-2 public health emergency.  Safety protocols were in place, including screening questions prior to the visit, additional usage of staff PPE, and extensive cleaning of exam room while observing appropriate contact time as indicated for disinfecting solutions.  ? ?

## 2021-07-02 ENCOUNTER — Encounter: Payer: Self-pay | Admitting: Internal Medicine

## 2021-07-02 NOTE — Assessment & Plan Note (Signed)
Here for CPX ?HTN: On beta-blockers, BP is great ?Anxiety: On sertraline and Xanax as needed, check UDS ?History of migraines: At baseline.  Occasional symptoms. ?RTC 1 year ?

## 2021-07-02 NOTE — Assessment & Plan Note (Signed)
Tdap 2016 ?Covid VAX: d/w pt  ?Female care: Dr. Renaldo Fiddler, will d/w gyn [facility] ?Labs: CMP, FLP, CBC, TSH, UDS ?Diet exercise:  very active, yoga, tennis.  Diet is  healthy. ?Does take vitamin D supplements ?  ?   ?

## 2021-07-04 LAB — DRUG MONITORING PANEL 375977 , URINE
Alcohol Metabolites: NEGATIVE ng/mL (ref <500)
Alphahydroxyalprazolam: NEGATIVE ng/mL (ref <25)
Alphahydroxymidazolam: NEGATIVE ng/mL (ref <50)
Alphahydroxytriazolam: NEGATIVE ng/mL (ref <50)
Aminoclonazepam: NEGATIVE ng/mL (ref <25)
Amphetamines: NEGATIVE ng/mL (ref <500)
Barbiturates: NEGATIVE ng/mL (ref <300)
Benzodiazepines: NEGATIVE ng/mL (ref <100)
Cocaine Metabolite: NEGATIVE ng/mL (ref <150)
Desmethyltramadol: NEGATIVE ng/mL (ref <100)
Hydroxyethylflurazepam: NEGATIVE ng/mL (ref <50)
Lorazepam: NEGATIVE ng/mL (ref <50)
Marijuana Metabolite: NEGATIVE ng/mL (ref <20)
Nordiazepam: NEGATIVE ng/mL (ref <50)
Opiates: NEGATIVE ng/mL (ref <100)
Oxazepam: NEGATIVE ng/mL (ref <50)
Oxycodone: NEGATIVE ng/mL (ref <100)
Temazepam: NEGATIVE ng/mL (ref <50)
Tramadol: NEGATIVE ng/mL (ref <100)

## 2021-07-04 LAB — DM TEMPLATE

## 2021-08-21 ENCOUNTER — Other Ambulatory Visit: Payer: Self-pay | Admitting: Internal Medicine

## 2021-09-11 ENCOUNTER — Other Ambulatory Visit: Payer: Self-pay | Admitting: Internal Medicine

## 2021-09-30 DIAGNOSIS — Z01419 Encounter for gynecological examination (general) (routine) without abnormal findings: Secondary | ICD-10-CM | POA: Diagnosis not present

## 2021-09-30 DIAGNOSIS — Z6823 Body mass index (BMI) 23.0-23.9, adult: Secondary | ICD-10-CM | POA: Diagnosis not present

## 2021-09-30 DIAGNOSIS — Z1231 Encounter for screening mammogram for malignant neoplasm of breast: Secondary | ICD-10-CM | POA: Diagnosis not present

## 2021-10-01 ENCOUNTER — Other Ambulatory Visit: Payer: Self-pay | Admitting: Obstetrics and Gynecology

## 2021-10-01 DIAGNOSIS — R928 Other abnormal and inconclusive findings on diagnostic imaging of breast: Secondary | ICD-10-CM

## 2021-10-23 ENCOUNTER — Ambulatory Visit: Payer: BC Managed Care – PPO

## 2021-10-23 ENCOUNTER — Ambulatory Visit
Admission: RE | Admit: 2021-10-23 | Discharge: 2021-10-23 | Disposition: A | Discharge status: Home / Self Care | Payer: BC Managed Care – PPO | Source: Ambulatory Visit | Attending: Obstetrics and Gynecology | Admitting: Obstetrics and Gynecology

## 2021-10-23 DIAGNOSIS — R928 Other abnormal and inconclusive findings on diagnostic imaging of breast: Secondary | ICD-10-CM

## 2021-10-23 DIAGNOSIS — R921 Mammographic calcification found on diagnostic imaging of breast: Secondary | ICD-10-CM | POA: Diagnosis not present

## 2021-10-23 DIAGNOSIS — N6011 Diffuse cystic mastopathy of right breast: Secondary | ICD-10-CM | POA: Diagnosis not present

## 2021-11-24 ENCOUNTER — Other Ambulatory Visit: Payer: Self-pay | Admitting: Internal Medicine

## 2021-12-03 ENCOUNTER — Encounter: Payer: Self-pay | Admitting: Internal Medicine

## 2022-01-11 ENCOUNTER — Telehealth: Payer: Self-pay | Admitting: Internal Medicine

## 2022-01-11 NOTE — Telephone Encounter (Signed)
Next visit should be in 06-2022, PDMP okay, prescription sent.

## 2022-01-11 NOTE — Telephone Encounter (Signed)
Letter sent to Pt 12/03/21 that she is due for cpx in March 2024.

## 2022-01-11 NOTE — Telephone Encounter (Signed)
Requesting: alprazolam 0.5mg   Contract:06/16/20 UDS:07/01/21 Last Visit:07/01/21 Next Visit: None Last Refill: 2/27/2 #60 and 0RF   Please Advise

## 2022-04-12 ENCOUNTER — Other Ambulatory Visit: Payer: Self-pay

## 2022-04-12 MED ORDER — SUMATRIPTAN SUCCINATE 50 MG PO TABS: ORAL_TABLET | ORAL | 5 refills | Status: DC

## 2022-05-07 ENCOUNTER — Other Ambulatory Visit: Payer: Self-pay | Admitting: Internal Medicine

## 2022-05-28 DIAGNOSIS — R0982 Postnasal drip: Secondary | ICD-10-CM | POA: Diagnosis not present

## 2022-05-28 DIAGNOSIS — R051 Acute cough: Secondary | ICD-10-CM | POA: Diagnosis not present

## 2022-05-28 DIAGNOSIS — I10 Essential (primary) hypertension: Secondary | ICD-10-CM | POA: Diagnosis not present

## 2022-05-28 DIAGNOSIS — J0140 Acute pansinusitis, unspecified: Secondary | ICD-10-CM | POA: Diagnosis not present

## 2022-08-18 ENCOUNTER — Other Ambulatory Visit: Payer: Self-pay | Admitting: Internal Medicine

## 2022-09-16 ENCOUNTER — Other Ambulatory Visit: Payer: Self-pay | Admitting: Internal Medicine

## 2022-09-24 ENCOUNTER — Telehealth: Payer: Self-pay | Admitting: Internal Medicine

## 2022-09-24 NOTE — Telephone Encounter (Signed)
PDMP okay, Rx sent 

## 2022-09-24 NOTE — Telephone Encounter (Signed)
Requesting: alprazolam 0.5mg  Contract: 06/16/20 UDS: 07/01/21 Last Visit: 07/01/21 Next Visit: None Last Refill: 01/11/22 #60 and 2rf  Please Advise

## 2022-10-25 DIAGNOSIS — Z1151 Encounter for screening for human papillomavirus (HPV): Secondary | ICD-10-CM | POA: Diagnosis not present

## 2022-10-25 DIAGNOSIS — Z1231 Encounter for screening mammogram for malignant neoplasm of breast: Secondary | ICD-10-CM | POA: Diagnosis not present

## 2022-10-25 DIAGNOSIS — Z01419 Encounter for gynecological examination (general) (routine) without abnormal findings: Secondary | ICD-10-CM | POA: Diagnosis not present

## 2022-10-25 DIAGNOSIS — Z6822 Body mass index (BMI) 22.0-22.9, adult: Secondary | ICD-10-CM | POA: Diagnosis not present

## 2022-10-25 DIAGNOSIS — Z124 Encounter for screening for malignant neoplasm of cervix: Secondary | ICD-10-CM | POA: Diagnosis not present

## 2022-10-25 DIAGNOSIS — N926 Irregular menstruation, unspecified: Secondary | ICD-10-CM | POA: Diagnosis not present

## 2022-10-25 LAB — HM PAP SMEAR: HM Pap smear: NEGATIVE

## 2022-10-25 LAB — HM MAMMOGRAPHY

## 2022-10-28 ENCOUNTER — Other Ambulatory Visit: Payer: Self-pay | Admitting: Internal Medicine

## 2022-11-03 ENCOUNTER — Encounter: Payer: Self-pay | Admitting: Internal Medicine

## 2022-11-05 ENCOUNTER — Encounter: Payer: BC Managed Care – PPO | Admitting: Internal Medicine

## 2022-11-10 ENCOUNTER — Encounter: Payer: Self-pay | Admitting: *Deleted

## 2022-11-10 ENCOUNTER — Encounter: Payer: Self-pay | Admitting: Internal Medicine

## 2022-11-10 ENCOUNTER — Ambulatory Visit (INDEPENDENT_AMBULATORY_CARE_PROVIDER_SITE_OTHER): Payer: BC Managed Care – PPO | Admitting: Internal Medicine

## 2022-11-10 VITALS — BP 122/68 | HR 61 | Temp 97.9°F | Resp 18 | Ht 66.0 in | Wt 144.2 lb

## 2022-11-10 DIAGNOSIS — Z0000 Encounter for general adult medical examination without abnormal findings: Secondary | ICD-10-CM | POA: Diagnosis not present

## 2022-11-10 DIAGNOSIS — I10 Essential (primary) hypertension: Secondary | ICD-10-CM | POA: Diagnosis not present

## 2022-11-10 DIAGNOSIS — F411 Generalized anxiety disorder: Secondary | ICD-10-CM

## 2022-11-10 DIAGNOSIS — Z79899 Other long term (current) drug therapy: Secondary | ICD-10-CM

## 2022-11-10 LAB — COMPREHENSIVE METABOLIC PANEL
ALT: 9 U/L (ref 0–35)
AST: 17 U/L (ref 0–37)
Albumin: 4.8 g/dL (ref 3.5–5.2)
Alkaline Phosphatase: 47 U/L (ref 39–117)
BUN: 12 mg/dL (ref 6–23)
CO2: 29 mEq/L (ref 19–32)
Calcium: 9.4 mg/dL (ref 8.4–10.5)
Chloride: 100 mEq/L (ref 96–112)
Creatinine, Ser: 0.94 mg/dL (ref 0.40–1.20)
GFR: 74.92 mL/min (ref >60.00)
Glucose, Bld: 96 mg/dL (ref 70–99)
Potassium: 4 mEq/L (ref 3.5–5.1)
Sodium: 136 mEq/L (ref 135–145)
Total Bilirubin: 0.6 mg/dL (ref 0.2–1.2)
Total Protein: 7.4 g/dL (ref 6.0–8.3)

## 2022-11-10 LAB — LIPID PANEL
Cholesterol: 250 mg/dL — ABNORMAL HIGH (ref 0–200)
HDL: 107.4 mg/dL (ref >39.00)
LDL Cholesterol: 123 mg/dL — ABNORMAL HIGH (ref 0–99)
NonHDL: 142.2
Total CHOL/HDL Ratio: 2
Triglycerides: 94 mg/dL (ref 0.0–149.0)
VLDL: 18.8 mg/dL (ref 0.0–40.0)

## 2022-11-10 LAB — CBC WITH DIFFERENTIAL/PLATELET
Basophils Absolute: 0.1 10*3/uL (ref 0.0–0.1)
Basophils Relative: 1.2 % (ref 0.0–3.0)
Eosinophils Absolute: 0.1 10*3/uL (ref 0.0–0.7)
Eosinophils Relative: 2.1 % (ref 0.0–5.0)
HCT: 42.8 % (ref 36.0–46.0)
Hemoglobin: 13.7 g/dL (ref 12.0–15.0)
Lymphocytes Relative: 28.4 % (ref 12.0–46.0)
Lymphs Abs: 1.2 10*3/uL (ref 0.7–4.0)
MCHC: 32 g/dL (ref 30.0–36.0)
MCV: 93.1 fl (ref 78.0–100.0)
Monocytes Absolute: 0.4 10*3/uL (ref 0.1–1.0)
Monocytes Relative: 9.4 % (ref 3.0–12.0)
Neutro Abs: 2.5 10*3/uL (ref 1.4–7.7)
Neutrophils Relative %: 58.9 % (ref 43.0–77.0)
Platelets: 254 10*3/uL (ref 150.0–400.0)
RBC: 4.6 Mil/uL (ref 3.87–5.11)
RDW: 13.4 % (ref 11.5–15.5)
WBC: 4.3 10*3/uL (ref 4.0–10.5)

## 2022-11-10 NOTE — Patient Instructions (Addendum)
Check the  blood pressure regularly BP GOAL is between 110/65 and  135/85. If it is consistently higher or lower, let me know  Vaccines Rx: COVID-vaccine Flu shot every fall  GO TO THE LAB : Get the blood work     GO TO THE FRONT DESK, PLEASE SCHEDULE YOUR APPOINTMENTS Come back for   a physical exam in 1 year

## 2022-11-10 NOTE — Assessment & Plan Note (Signed)
Here for CPX HTN: BP is great, normal ambulatory BPs, checking labs, continue beta-blockers Anxiety: On sertraline daily, on Xanax as needed only, contract signed today. Migraines: At baseline, on Imitrex as needed only RF meds as needed RTC 1 year

## 2022-11-10 NOTE — Assessment & Plan Note (Signed)
Here for CPX Tdap 2016 Covid VAX and flu shot recommended   Female care: Dr. Renaldo Fiddler, [facility] @ gyn Labs: Recently a vitamin D and TSH were normal at gyn per pt,  check a CMP FLP CBC. Diet exercise:    lifestyle remains very healthy, yoga, tennis.    Does take vitamin D supplements

## 2022-11-10 NOTE — Progress Notes (Signed)
Subjective:    Patient ID: Denise Roach, female    DOB: 03-23-1981, 42 y.o.   MRN: [id number]  DOS:  11/10/2022 Type of visit - description: CPX  Here for CPX. Feeling well. Has no major concerns  Review of Systems   A 14 point review of systems is negative    Past Medical History:  Diagnosis Date   Anxiety    B12 deficiency    History of chicken pox    History of kidney stones    Hypertension    dx as at age 55   Kidney stone    Migraines    Vitamin D deficiency     Past Surgical History:  Procedure Laterality Date   EXTRACORPOREAL SHOCK WAVE LITHOTRIPSY Left 01/09/2018   Procedure: LEFT EXTRACORPOREAL SHOCK WAVE LITHOTRIPSY (ESWL);  Surgeon: Rene Paci, MD;  Location: [facility];  Service: Urology;  Laterality: Left;   WISDOM TOOTH EXTRACTION     Social History   Socioeconomic History   Marital status: Married    Spouse name: Not on file   Number of children: 3   Years of education: Not on file   Highest education level: Not on file  Occupational History   Occupation: ADMIN ASSISTANT (part time)    Employer: [employer]  Tobacco Use   Smoking status: Never   Smokeless tobacco: Never  Vaping Use   Vaping status: Never Used  Substance and Sexual Activity   Alcohol use: Yes    Comment: rarely   Drug use: No   Sexual activity: Yes  Other Topics Concern   Not on file  Social History Narrative   3 children:    2013, boy   2010   2008   Social Determinants of Corporate investment banker Strain: Not on file  Food Insecurity: Not on file  Transportation Needs: Not on file  Physical Activity: Not on file  Stress: Not on file  Social Connections: Not on file  Intimate Partner Violence: Not on file     Current Outpatient Medications  Medication Instructions   acetaminophen (TYLENOL) 1,000 mg, Every 6 hours PRN   ALPRAZolam (XANAX) 0.5 MG tablet TAKE 1/2-1 TABLET BY MOUTH 3 TIMES DAILY AS NEEDED FOR ANXIETY   Cyanocobalamin  (B-12 PO) Oral   labetalol (NORMODYNE) 100 mg, Oral, 2 times daily   Multiple Vitamin (MULTIVITAMIN) tablet 1 tablet, Daily   sertraline (ZOLOFT) 25 mg, Oral, Daily   SUMAtriptan (IMITREX) 50 MG tablet TAKE 1-2 TABLETS BY MOUTH EVERY 2 HOURS AS NEEDED FOR MIGRAINE. MAY REPEAT IN 2 HOURS IF HEADACHE PERSISTS OR RECURS. NO MORE THAN 4 TABLETS IN A 24-HOUR.   SUMAtriptan 6 MG/0.5ML SOAJ INJECT 0.5MG  AS INSTRUCTED EVERY 2 HOURS AS NEEDED.MAX 2 DOSES/24 HRS   VITAMIN D PO Oral       Objective:   Physical Exam BP 122/68 (BP Location: Left Arm, Patient Position: Sitting, Cuff Size: Normal)   Pulse 61   Temp 97.9 F (36.6 C) (Oral)   Resp 18   Ht 5\' 6"  (1.676 m)   Wt 144 lb 3.2 oz (65.4 kg)   LMP 10/31/2022 (Exact Date)   SpO2 100%   BMI 23.27 kg/m  General: Well developed, NAD, BMI noted Neck: No  thyromegaly  HEENT:  Normocephalic . Face symmetric, atraumatic Lungs:  CTA B Normal respiratory effort, no intercostal retractions, no accessory muscle use. Heart: RRR,  no murmur.  Abdomen:  Not distended, soft, non-tender. No rebound or  rigidity.   Lower extremities: no pretibial edema bilaterally  Skin: Exposed areas without rash. Not pale. Not jaundice Neurologic:  alert & oriented X3.  Speech normal, gait appropriate for age and unassisted Strength symmetric and appropriate for age.  Psych: Cognition and judgment appear intact.  Cooperative with normal attention span and concentration.  Behavior appropriate. No anxious or depressed appearing.     Assessment     Assessment  HTN dx age 38 Anxiety-- xanax prn  H/o Migraines -- on imitrex  H/o urolithiasis , last 01/2018 Birth control: husband's vasectomy  PLAN Here for CPX Tdap 2016 Covid VAX and flu shot recommended   Female care: Dr. Renaldo Fiddler, [facility] @ gyn Labs: Recently a vitamin D and TSH were normal at gyn per pt,  check a CMP FLP CBC. Diet exercise:    lifestyle remains very healthy, yoga, tennis.    Does take  vitamin D supplements HTN: BP is great, normal ambulatory BPs, checking labs, continue beta-blockers Anxiety: On sertraline daily, on Xanax as needed only, contract signed today. Migraines: At baseline, on Imitrex as needed only RF meds as needed RTC 1 year

## 2022-11-18 ENCOUNTER — Other Ambulatory Visit: Payer: Self-pay | Admitting: Internal Medicine

## 2023-01-13 ENCOUNTER — Other Ambulatory Visit: Payer: Self-pay | Admitting: Internal Medicine

## 2023-01-13 DIAGNOSIS — H04121 Dry eye syndrome of right lacrimal gland: Secondary | ICD-10-CM | POA: Diagnosis not present

## 2023-02-24 ENCOUNTER — Encounter: Payer: Self-pay | Admitting: Internal Medicine

## 2023-02-28 ENCOUNTER — Other Ambulatory Visit: Payer: Self-pay | Admitting: Internal Medicine

## 2023-03-09 ENCOUNTER — Telehealth: Payer: Self-pay | Admitting: Internal Medicine

## 2023-03-09 ENCOUNTER — Other Ambulatory Visit: Payer: Self-pay | Admitting: Internal Medicine

## 2023-03-09 NOTE — Telephone Encounter (Signed)
PDMP reviewed, Rx sent Next visit should be around 11-2023

## 2023-03-10 DIAGNOSIS — L814 Other melanin hyperpigmentation: Secondary | ICD-10-CM | POA: Diagnosis not present

## 2023-03-10 DIAGNOSIS — D485 Neoplasm of uncertain behavior of skin: Secondary | ICD-10-CM | POA: Diagnosis not present

## 2023-03-10 DIAGNOSIS — L570 Actinic keratosis: Secondary | ICD-10-CM | POA: Diagnosis not present

## 2023-06-20 DIAGNOSIS — L814 Other melanin hyperpigmentation: Secondary | ICD-10-CM | POA: Diagnosis not present

## 2023-06-20 DIAGNOSIS — D225 Melanocytic nevi of trunk: Secondary | ICD-10-CM | POA: Diagnosis not present

## 2023-06-20 DIAGNOSIS — I781 Nevus, non-neoplastic: Secondary | ICD-10-CM | POA: Diagnosis not present

## 2023-06-20 DIAGNOSIS — D485 Neoplasm of uncertain behavior of skin: Secondary | ICD-10-CM | POA: Diagnosis not present

## 2023-06-20 DIAGNOSIS — L821 Other seborrheic keratosis: Secondary | ICD-10-CM | POA: Diagnosis not present

## 2023-06-20 DIAGNOSIS — L570 Actinic keratosis: Secondary | ICD-10-CM | POA: Diagnosis not present

## 2023-06-24 ENCOUNTER — Other Ambulatory Visit: Payer: Self-pay | Admitting: Internal Medicine

## 2023-07-10 ENCOUNTER — Other Ambulatory Visit: Payer: Self-pay | Admitting: Internal Medicine

## 2023-07-15 ENCOUNTER — Telehealth: Payer: Self-pay

## 2023-07-15 NOTE — Telephone Encounter (Signed)
 [facility] and [facility] but some how in the process of transferring it the prescription had been discontinued. [facility] [address]   Copied from CRM [id number]. Topic: Clinical - Prescription Issue >> Jul 15, 2023 11:59 AM Denise Roach wrote: Reason for CRM: Patient has one SUMAtriptan 6 MG/0.5ML SOAJ left and states that the pharmacy said that it was deactivated. She had prescription [facility]. She stated that [facility] didn't have any in stock so patient had it transferred to another [facility]. Patient Is going out of town and really needs it.

## 2023-08-02 ENCOUNTER — Telehealth: Payer: Self-pay

## 2023-08-02 MED ORDER — SUMATRIPTAN SUCCINATE 6 MG/0.5ML ~~LOC~~ SOAJ: 0.5000 mg | SUBCUTANEOUS | 5 refills | Status: DC | PRN

## 2023-08-02 NOTE — Telephone Encounter (Signed)
 Rx sent on 4/11, 4/28 and today.

## 2023-08-02 NOTE — Telephone Encounter (Signed)
 Copied from CRM [id number]. Topic: Clinical - Prescription Issue >> Aug 02, 2023 12:30 PM Aisha D wrote: Reason for CRM:  Patient is out of the SUMAtriptan  6 MG/0.5ML SOAJ left and states that the pharmacy said that it was deactivated. Patient stated that she would [facility] [address] and the pharmacy stated that the patient needs a new prescription written. Patient stated she called in about this on 4/11 but hasn't been resolved yet.

## 2023-09-14 ENCOUNTER — Telehealth: Payer: Self-pay | Admitting: Internal Medicine

## 2023-09-15 NOTE — Telephone Encounter (Signed)
 Requesting: alprazolam  0.5mg   Contract: 11/10/22 UDS: 11/10/22 Last Visit: 11/10/22 Next Visit: None Last Refill: 03/09/23 #60 and 3RF   Please Advise

## 2023-09-15 NOTE — Telephone Encounter (Signed)
 PDMP okay, Rx sent

## 2023-10-06 DIAGNOSIS — L237 Allergic contact dermatitis due to plants, except food: Secondary | ICD-10-CM | POA: Diagnosis not present

## 2023-10-06 DIAGNOSIS — Z6822 Body mass index (BMI) 22.0-22.9, adult: Secondary | ICD-10-CM | POA: Diagnosis not present

## 2023-11-02 ENCOUNTER — Encounter: Payer: Self-pay | Admitting: Internal Medicine

## 2023-11-03 ENCOUNTER — Other Ambulatory Visit: Payer: Self-pay | Admitting: Internal Medicine

## 2023-11-03 ENCOUNTER — Ambulatory Visit: Payer: Self-pay

## 2023-11-03 MED ORDER — SUMATRIPTAN SUCCINATE 50 MG PO TABS: ORAL_TABLET | ORAL | 5 refills | Status: DC

## 2023-11-03 NOTE — Telephone Encounter (Signed)
 Pt states that migraines are occurring more often. Appt for tomorrow to discuss. Pt requests refill today.

## 2023-11-03 NOTE — Telephone Encounter (Signed)
 Appt scheduled

## 2023-11-03 NOTE — Telephone Encounter (Signed)
 FYI Only or Action Required?: Action required by provider: medication refill request.  Patient was last seen in primary care on 11/10/2022 by Amon Aloysius BRAVO, MD.  Called Nurse Triage reporting Headache.  Tension and migraine  Symptoms began ongoing.  Interventions attempted: Prescription medications: Sumatriptan  also lavender  and warm showers.  Symptoms are: gradually worsening.  Triage Disposition: See PCP When Office is Open (Within 3 Days)  Patient/caregiver understands and will follow disposition?: Yes                    Copied from CRM #[id number]. Topic: Clinical - Red Word Triage >> Nov 03, 2023  8:38 AM Suzen RAMAN wrote: Red Word that prompted transfer to Nurse Triage: Severe Headaches(rated at a 10 at time) Reason for Disposition  [1] MILD-MODERATE headache AND [2] present > 3 days (72 hours) AND [3] no improvement after using Care Advice  Answer Assessment - Initial Assessment Questions 1. LOCATION: Where does it hurt?      Forehead and base of neck 2. ONSET: When did the headache start? (e.g., minutes, hours, days)      About every day 3. PATTERN: Does the pain come and go, or has it been constant since it started?     Comes and goes 4. SEVERITY: How bad is the pain? and What does it keep you from doing?  (e.g., Scale 1-10; mild, moderate, or severe)     3/10 now  - was an 8/10 this orning 5. RECURRENT SYMPTOM: Have you ever had headaches before? If Yes, ask: When was the last time? and What happened that time?      yes 6. CAUSE: What do you think is causing the headache?     Tension 7. MIGRAINE: Have you been diagnosed with migraine headaches? If Yes, ask: Is this headache similar?      yes 8. HEAD INJURY: Has there been any recent injury to your head?      no 9. OTHER SYMPTOMS: Do you have any other symptoms? (e.g., fever, stiff neck, eye pain, sore throat, cold symptoms)     Stiff neck  Protocols used: Headache-A-AH

## 2023-11-04 ENCOUNTER — Ambulatory Visit: Admitting: Family

## 2023-11-04 VITALS — BP 156/104 | HR 78 | Temp 98.7°F | Resp 16 | Ht <= 58 in | Wt 147.0 lb

## 2023-11-04 DIAGNOSIS — I10 Essential (primary) hypertension: Secondary | ICD-10-CM | POA: Diagnosis not present

## 2023-11-04 DIAGNOSIS — G43909 Migraine, unspecified, not intractable, without status migrainosus: Secondary | ICD-10-CM | POA: Diagnosis not present

## 2023-11-04 MED ORDER — AMLODIPINE BESYLATE 5 MG PO TABS: 5.0000 mg | ORAL_TABLET | Freq: Every day | ORAL | 0 refills | Status: DC

## 2023-11-04 MED ORDER — KETOROLAC TROMETHAMINE 60 MG/2ML IM SOLN
60.0000 mg | Freq: Once | INTRAMUSCULAR | Status: AC
  Administered 2023-11-04: 60 mg via INTRAMUSCULAR

## 2023-11-04 NOTE — Assessment & Plan Note (Addendum)
 Uncontrolled on labetalol  100mg  bid. Add amlodipine 5mg  once daily.  Follow up with PCP in 2 weeks.

## 2023-11-04 NOTE — Assessment & Plan Note (Signed)
 Uncontrolled, ? If due to uncontrolled BP. Will give toradol 60mg  IM today in office.  Hopefully migraines will improve with improved BP control. If not, consider daily preventive rx for migraine prophylaxis.

## 2023-11-04 NOTE — Patient Instructions (Addendum)
 VISIT SUMMARY:  Today, we addressed your worsening migraines and long-standing hypertension. We discussed your current treatments and made some adjustments to help manage your symptoms better.  YOUR PLAN:  MIGRAINE: You have been experiencing more frequent migraines, often linked to your menstrual cycle, lack of sleep, and physical stressors. You are concerned about medication overuse and rebound headaches. -Administered a Toradol injection for acute relief today. -We can discuss future migraine prevention medication if your migraines don't improve with the new blood pressure medication.  ESSENTIAL HYPERTENSION: Your long-standing hypertension has been managed with labetalol , but the current dose may be insufficient. -Prescribed amlodipine for additional blood pressure control. -Monitor for potential ankle swelling as a side effect. -Schedule a follow-up appointment in two weeks to assess blood pressure control and migraine frequency.

## 2023-11-04 NOTE — Progress Notes (Signed)
 Subjective:     Patient ID: Denise Roach, female    DOB: [birth year], 43 y.o.   MRN: [id number]  Chief Complaint  Patient presents with   Headache    Complains of daily headaches/ migraines. More frequent than usual     Headache     Discussed the use of AI scribe software for clinical note transcription with the patient, who gave verbal consent to proceed.  History of Present Illness  Denise Roach is a 43 year old female with hypertension who presents with worsening migraines.  Migraines have increased in frequency over the past month, particularly in the last two weeks, requiring daily use of Imitrex . She is concerned about medication overuse and rebound headaches. Migraines are often linked to her menstrual cycle, lack of sleep, and physical stressors.  She experiences sinus pain upon waking, possibly due to nocturnal teeth clenching, which often progresses to a migraine by day's end. Nausea and occipital pain are present upon waking, with persistent soreness.  Hypertension has been managed with labetalol  since age 58, with no dose adjustments despite her current age of 51.  She practices hot yoga regularly but has avoided it recently due to heat-induced headaches. She struggles to break the cycle of symptoms and is concerned about her impact on daily life.     Health Maintenance Due  Topic Date Due   Hepatitis B Vaccines (1 of 3 - 19+ 3-dose series) Never done   COVID-19 Vaccine (4 - 2024-25 season) 12/05/2022   INFLUENZA VACCINE  11/04/2023    Past Medical History:  Diagnosis Date   Anxiety    B12 deficiency    History of chicken pox    History of kidney stones    Hypertension    dx as at age 30   Kidney stone    Migraines    Vitamin D  deficiency     Past Surgical History:  Procedure Laterality Date   EXTRACORPOREAL SHOCK WAVE LITHOTRIPSY Left 01/09/2018   Procedure: LEFT EXTRACORPOREAL SHOCK WAVE LITHOTRIPSY (ESWL);  Surgeon: Devere Lonni Righter, MD;  Location: [facility];  Service: Urology;  Laterality: Left;   WISDOM TOOTH EXTRACTION      Family History  Problem Relation Age of Onset   Stroke Paternal Grandmother    Heart attack Paternal Grandmother    Stroke Maternal Grandmother    Diabetes Maternal Grandfather    Heart disease Maternal Grandfather    Hypertension Mother    Colon cancer Neg Hx    Breast cancer Neg Hx     Social History   Socioeconomic History   Marital status: Married    Spouse name: Not on file   Number of children: 3   Years of education: Not on file   Highest education level: Not on file  Occupational History   Occupation: Advertising account executive (part time)    Employer: [employer]  Tobacco Use   Smoking status: Never   Smokeless tobacco: Never  Vaping Use   Vaping status: Never Used  Substance and Sexual Activity   Alcohol use: Yes    Comment: rarely   Drug use: No   Sexual activity: Yes  Other Topics Concern   Not on file  Social History Narrative   3 children:    2013, boy   2010   2008   Social Drivers of Corporate investment banker Strain: Not on [employer] Insecurity: Not on file  Transportation Needs: Not on file  Physical  Activity: Not on file  Stress: Not on file  Social Connections: Not on file  Intimate Partner Violence: Not on file    Outpatient Medications Prior to Visit  Medication Sig Dispense Refill   acetaminophen  (TYLENOL ) 500 MG tablet Take 1,000 mg by mouth every 6 (six) hours as needed.     ALPRAZolam  (XANAX ) 0.5 MG tablet TAKE 1/2-1 TABLET BY MOUTH 3 TIMES DAILY AS NEEDED FOR ANXIETY 60 tablet 1   Cyanocobalamin  (B-12 PO) Take by mouth.     labetalol  (NORMODYNE ) 200 MG tablet Take 0.5 tablets (100 mg total) by mouth 2 (two) times daily. 90 tablet 3   Multiple Vitamin (MULTIVITAMIN) tablet Take 1 tablet by mouth daily.     sertraline (ZOLOFT) 25 MG tablet Take 25 mg by mouth daily.     SUMAtriptan  (IMITREX ) 50 MG tablet TAKE 1-2 TABS BY  MOUTH AS NEEDED FOR MIGRAINE. MAY REPEAT IN 2 HOURS IF NEEDED. MAX 4 TABS/24 HOURS 8 tablet 5   SUMAtriptan  6 MG/0.5ML SOAJ Inject 0.5 mg into the skin every 2 (two) hours as needed. Max 2 doses per 24hrs 3 mL 5   VITAMIN D  PO Take by mouth.     No facility-administered medications prior to visit.    No Known Allergies  Review of Systems  Neurological:  Positive for headaches.      Objective:    Physical Exam Constitutional:      General: She is not in acute distress.    Appearance: Normal appearance. She is well-developed.  HENT:     Head: Normocephalic and atraumatic.     Right Ear: External ear normal.     Left Ear: External ear normal.  Eyes:     General: No scleral icterus.    Extraocular Movements: Extraocular movements intact.     Pupils: Pupils are equal, round, and reactive to light.  Neck:     Thyroid : No thyromegaly.  Cardiovascular:     Rate and Rhythm: Normal rate and regular rhythm.     Heart sounds: Normal heart sounds. No murmur heard. Pulmonary:     Effort: Pulmonary effort is normal. No respiratory distress.     Breath sounds: Normal breath sounds. No wheezing.  Musculoskeletal:     Cervical back: Neck supple.  Skin:    General: Skin is warm and dry.  Neurological:     Mental Status: She is alert and oriented to person, place, and time.  Psychiatric:        Mood and Affect: Mood normal.        Behavior: Behavior normal.        Thought Content: Thought content normal.        Judgment: Judgment normal.      BP (!) 156/104 (BP Location: Right Arm, Patient Position: Sitting, Cuff Size: Small)   Pulse 78   Temp 98.7 F (37.1 C) (Oral)   Resp 16   Ht 1' (0.305 m)   Wt 147 lb (66.7 kg)   SpO2 99%   BMI 717.72 kg/m  Wt Readings from Last 3 Encounters:  11/04/23 147 lb (66.7 kg)  11/10/22 144 lb 3.2 oz (65.4 kg)  07/01/21 143 lb 4 oz (65 kg)       Assessment & Plan:   Problem List Items Addressed This Visit       Unprioritized    Migraine   Uncontrolled, ? If due to uncontrolled BP. Will give toradol 60mg  IM today in office.  Hopefully migraines will improve  with improved BP control. If not, consider daily preventive rx for migraine prophylaxis.        Relevant Medications   amLODipine (NORVASC) 5 MG tablet   Essential hypertension - Primary   Uncontrolled on labetalol  100mg  bid. Add amlodipine 5mg  once daily.  Follow up with PCP in 2 weeks.      Relevant Medications   amLODipine (NORVASC) 5 MG tablet    I am having Denise Roach start on amLODipine. I am also having her maintain her acetaminophen , multivitamin, sertraline, VITAMIN D  PO, Cyanocobalamin  (B-12 PO), labetalol , SUMAtriptan , ALPRAZolam , and SUMAtriptan .  Meds ordered this encounter  Medications   amLODipine (NORVASC) 5 MG tablet    Sig: Take 1 tablet (5 mg total) by mouth daily.    Dispense:  90 tablet    Refill:  0    Supervising Provider:   DOMENICA BLACKBIRD A [[id number]]

## 2023-11-08 DIAGNOSIS — Z01419 Encounter for gynecological examination (general) (routine) without abnormal findings: Secondary | ICD-10-CM | POA: Diagnosis not present

## 2023-11-08 DIAGNOSIS — Z6823 Body mass index (BMI) 23.0-23.9, adult: Secondary | ICD-10-CM | POA: Diagnosis not present

## 2023-11-08 DIAGNOSIS — Z1231 Encounter for screening mammogram for malignant neoplasm of breast: Secondary | ICD-10-CM | POA: Diagnosis not present

## 2023-11-08 LAB — HM MAMMOGRAPHY

## 2023-11-14 ENCOUNTER — Other Ambulatory Visit: Payer: Self-pay | Admitting: Obstetrics and Gynecology

## 2023-11-14 DIAGNOSIS — R928 Other abnormal and inconclusive findings on diagnostic imaging of breast: Secondary | ICD-10-CM

## 2023-11-18 ENCOUNTER — Encounter: Payer: Self-pay | Admitting: Internal Medicine

## 2023-11-21 ENCOUNTER — Encounter: Payer: Self-pay | Admitting: Internal Medicine

## 2023-11-21 ENCOUNTER — Ambulatory Visit: Admitting: Internal Medicine

## 2023-11-21 ENCOUNTER — Ambulatory Visit
Admission: RE | Admit: 2023-11-21 | Discharge: 2023-11-21 | Disposition: A | Discharge status: Home / Self Care | Source: Ambulatory Visit | Attending: Obstetrics and Gynecology | Admitting: Obstetrics and Gynecology

## 2023-11-21 VITALS — BP 142/74 | HR 76 | Temp 97.9°F | Resp 16 | Ht 66.0 in | Wt 143.2 lb

## 2023-11-21 DIAGNOSIS — I10 Essential (primary) hypertension: Secondary | ICD-10-CM

## 2023-11-21 DIAGNOSIS — R928 Other abnormal and inconclusive findings on diagnostic imaging of breast: Secondary | ICD-10-CM

## 2023-11-21 DIAGNOSIS — N6011 Diffuse cystic mastopathy of right breast: Secondary | ICD-10-CM | POA: Diagnosis not present

## 2023-11-21 DIAGNOSIS — R519 Headache, unspecified: Secondary | ICD-10-CM

## 2023-11-21 MED ORDER — LABETALOL HCL 200 MG PO TABS: ORAL_TABLET | ORAL | Status: DC

## 2023-11-21 NOTE — Progress Notes (Unsigned)
 Subjective:    Patient ID: Denise Roach, female    DOB: 1980/08/22, 43 y.o.   MRN: [id number]  DOS:  11/21/2023 Type of visit - description: Acute  Seen at this office 11/04/2022:  Presented with increased migraines over the past month. Using Imitrex  daily. Did receive a dose of Toradol . Went home and slept for almost 24 hours, feeling much better since then.  Also during the visit, BP was elevated at 156/104.  Heart rate 78. Amlodipine  added. BP at home today 150/92. She just do not like how amlodipine  makes her feel: Slightly tired, little shaky.   Review of Systems See above   Past Medical History:  Diagnosis Date   Anxiety    B12 deficiency    History of chicken pox    History of kidney stones    Hypertension    dx as at age 34   Kidney stone    Migraines    Vitamin D  deficiency     Past Surgical History:  Procedure Laterality Date   EXTRACORPOREAL SHOCK WAVE LITHOTRIPSY Left 01/09/2018   Procedure: LEFT EXTRACORPOREAL SHOCK WAVE LITHOTRIPSY (ESWL);  Surgeon: Devere Lonni Righter, MD;  Location: [facility];  Service: Urology;  Laterality: Left;   WISDOM TOOTH EXTRACTION      Current Outpatient Medications  Medication Instructions   acetaminophen  (TYLENOL ) 1,000 mg, Every 6 hours PRN   ALPRAZolam  (XANAX ) 0.5 MG tablet TAKE 1/2-1 TABLET BY MOUTH 3 TIMES DAILY AS NEEDED FOR ANXIETY   amLODipine  (NORVASC ) 5 mg, Oral, Daily   Cyanocobalamin  (B-12 PO) Take by mouth.   labetalol  (NORMODYNE ) 100 mg, Oral, 2 times daily   Multiple Vitamin (MULTIVITAMIN) tablet 1 tablet, Daily   sertraline (ZOLOFT) 25 mg, Daily   SUMAtriptan  (IMITREX ) 50 MG tablet TAKE 1-2 TABS BY MOUTH AS NEEDED FOR MIGRAINE. MAY REPEAT IN 2 HOURS IF NEEDED. MAX 4 TABS/24 HOURS   SUMAtriptan  0.5 mg, Subcutaneous, Every 2 hours PRN, Max 2 doses per 24hrs   VITAMIN D  PO Take by mouth.       Objective:   Physical Exam BP (!) 142/74   Pulse 76   Temp 97.9 F (36.6 C) (Oral)   Resp 16   Ht 5'  6 (1.676 m)   Wt 143 lb 4 oz (65 kg)   LMP 11/20/2023 (Exact Date)   SpO2 96%   BMI 23.12 kg/m  General:   Well developed, NAD, BMI noted. HEENT:  Normocephalic . Face symmetric, atraumatic Lungs:  CTA B Normal respiratory effort, no intercostal retractions, no accessory muscle use. Heart: RRR,  no murmur.  Lower extremities: no pretibial edema bilaterally  Skin: Not pale. Not jaundice Neurologic:  alert & oriented X3.  Speech normal, gait appropriate for age and unassisted Psych--  Cognition and judgment appear intact.  Cooperative with normal attention span and concentration.  Behavior appropriate. No anxious or depressed appearing.      Assessment      Assessment  HTN dx age 23 Anxiety-- xanax  prn  H/o Migraines -- on imitrex   H/o urolithiasis , last 01/2018 Birth control: husband's vasectomy  PLAN Migraines: Typically well-controlled, in the last month she had frequent migraines, essentially daily, they did respond acutely to Imitrex  but they kept coming daily. Eventually she came to this office, got a Toradol  shot, felt much better since then, she thinks because Toradol  allowed her to sleep for many hours. We talked about further steps: Prevented any medication?  She is interested but somewhat hesitant about  it. Plan: Continue Imitrex  for now, refer to headache specialist for further advice.  May benefit from other meds HTN: Has been well-controlled on labetalol  200 mg: was RX  1/2 po bid but takes 1 po bid (RF by gyn).   BP was noted to be elevated in the context of frequent migraines, we do not know if prior to that BPs were okay.  Amlodipine  was added but patient does not like this medication due to fatigue and shakiness. We agreed on the following:  Stop amlodipine  Increase amlodipine  200 mg to 1.5 tablet twice daily. Monitor BPs and heart rate.  See AVS.  Call if problems. RTC 4 to 5 weeks CPX

## 2023-11-21 NOTE — Patient Instructions (Addendum)
 Stop amlodipine   Increase labetalol  200 mg: 1.5 tablet twice daily Let me know if he is too strong   Check your heart rate: Should not be less than 55  Check the  blood pressure regularly Blood pressure goal:  between 110/65 and  135/85. If it is consistently higher or lower, let me know   Will refer you to a headache specialist  Next office visit for a physical exam in 4 to 6 weeks Please make an appointment before you leave today

## 2023-11-22 ENCOUNTER — Telehealth: Payer: Self-pay | Admitting: Internal Medicine

## 2023-11-22 NOTE — Assessment & Plan Note (Signed)
 Migraines: Typically well-controlled, in the last month she had frequent migraines, essentially daily, they did respond acutely to Imitrex  but they kept coming daily. Eventually she came to this office, got a Toradol  shot, felt much better since then, she thinks because Toradol  allowed her to sleep for many hours. We talked about further steps: Prevented any medication?  She is interested but somewhat hesitant about it. Plan: Continue Imitrex  for now, refer to headache specialist for further advice.  May benefit from other meds HTN: Has been well-controlled on labetalol  200 mg: was RX  1/2 po bid but takes 1 po bid (RF by gyn).   BP was noted to be elevated in the context of frequent migraines, we do not know if prior to that BPs were okay.  Amlodipine  was added but patient does not like this medication due to fatigue and shakiness. We agreed on the following:  Stop amlodipine  Increase amlodipine  200 mg to 1.5 tablet twice daily. Monitor BPs and heart rate.  See AVS.  Call if problems. RTC 4 to 5 weeks CPX

## 2023-11-22 NOTE — Telephone Encounter (Signed)
 Copied from CRM [id number]. Topic: Referral - Question >> Nov 22, 2023  1:48 PM Ismael A wrote: Reason for CRM: [facility] ph: [phone] fax: [phone] - requesting office notes regarding headaches, mri/ct records, and recent labs or ekg

## 2023-11-22 NOTE — Telephone Encounter (Signed)
 Were notes sent w/ referral?

## 2023-11-25 NOTE — Telephone Encounter (Signed)
 Thank you :)

## 2023-12-09 DIAGNOSIS — L237 Allergic contact dermatitis due to plants, except food: Secondary | ICD-10-CM | POA: Diagnosis not present

## 2023-12-09 DIAGNOSIS — Z6822 Body mass index (BMI) 22.0-22.9, adult: Secondary | ICD-10-CM | POA: Diagnosis not present

## 2023-12-09 DIAGNOSIS — R21 Rash and other nonspecific skin eruption: Secondary | ICD-10-CM | POA: Diagnosis not present

## 2024-01-05 ENCOUNTER — Telehealth: Payer: Self-pay | Admitting: Internal Medicine

## 2024-01-06 NOTE — Telephone Encounter (Signed)
 Requesting: alprazolam  0.5mg   Contract:11/10/22 UDS: 11/10/22 Last Visit: 11/21/23 Next Visit: 01/10/24 Last Refill: 09/15/23 #60 and 1RF   Please Advise

## 2024-01-06 NOTE — Telephone Encounter (Signed)
 PDMP okay, Rx sent

## 2024-01-09 ENCOUNTER — Other Ambulatory Visit: Payer: Self-pay | Admitting: Internal Medicine

## 2024-01-09 DIAGNOSIS — G43019 Migraine without aura, intractable, without status migrainosus: Secondary | ICD-10-CM | POA: Diagnosis not present

## 2024-01-09 DIAGNOSIS — Z79899 Other long term (current) drug therapy: Secondary | ICD-10-CM | POA: Diagnosis not present

## 2024-01-09 DIAGNOSIS — Z049 Encounter for examination and observation for unspecified reason: Secondary | ICD-10-CM | POA: Diagnosis not present

## 2024-01-10 ENCOUNTER — Encounter: Admitting: Internal Medicine

## 2024-01-24 ENCOUNTER — Ambulatory Visit: Admitting: Internal Medicine

## 2024-01-30 ENCOUNTER — Other Ambulatory Visit: Payer: Self-pay | Admitting: Family

## 2024-01-30 DIAGNOSIS — I10 Essential (primary) hypertension: Secondary | ICD-10-CM

## 2024-02-17 ENCOUNTER — Telehealth: Payer: Self-pay | Admitting: *Deleted

## 2024-02-17 MED ORDER — SUMATRIPTAN SUCCINATE 6 MG/0.5ML ~~LOC~~ SOAJ: 0.5000 mg | SUBCUTANEOUS | 5 refills | Status: AC | PRN

## 2024-02-17 NOTE — Telephone Encounter (Signed)
 Copied from CRM #[id number]. Topic: Clinical - Prescription Issue >> Feb 16, 2024  3:00 PM Jasmin G wrote: Reason for CRM: Pt states that she was told by her preferred pharmacy, [id number] - [place], [place] - [address] AT [address] that her SUMAtriptan  6 MG/0.5ML SOAJ was denied for refill, I could not find info to relay. Please refill med or call pt back at [phone] to discuss.

## 2024-02-17 NOTE — Telephone Encounter (Signed)
 Rx sent in for patient and left voicemail for patient that it has been sent into her pharmacy.

## 2024-03-20 ENCOUNTER — Other Ambulatory Visit: Payer: Self-pay | Admitting: Internal Medicine

## 2024-03-20 NOTE — Telephone Encounter (Signed)
 Requesting: alprazolam  0.5mg  Contract: 11/10/22 UDS: 11/10/22 Last Visit: 11/21/23 Next Visit: 04/18/24 Last Refill: 01/06/24 #60 and 1rf   Please Advise.

## 2024-04-18 ENCOUNTER — Ambulatory Visit: Admitting: Internal Medicine

## 2024-04-18 ENCOUNTER — Encounter: Payer: Self-pay | Admitting: Internal Medicine

## 2024-04-18 VITALS — BP 138/80 | HR 82 | Temp 98.3°F | Resp 16 | Ht 66.0 in | Wt 132.0 lb

## 2024-04-18 DIAGNOSIS — G43909 Migraine, unspecified, not intractable, without status migrainosus: Secondary | ICD-10-CM | POA: Diagnosis not present

## 2024-04-18 DIAGNOSIS — I10 Essential (primary) hypertension: Secondary | ICD-10-CM

## 2024-04-18 DIAGNOSIS — Z79899 Other long term (current) drug therapy: Secondary | ICD-10-CM | POA: Diagnosis not present

## 2024-04-18 DIAGNOSIS — F411 Generalized anxiety disorder: Secondary | ICD-10-CM | POA: Diagnosis not present

## 2024-04-18 MED ORDER — LABETALOL HCL 200 MG PO TABS: 200.0000 mg | ORAL_TABLET | Freq: Two times a day (BID) | ORAL | Status: AC

## 2024-04-18 NOTE — Progress Notes (Signed)
 "  Subjective:    Patient ID: Denise Roach, female    DOB: 04-10-1980, 44 y.o.   MRN: [id number]  DOS:  04/18/2024 Follow-up  Discussed the use of AI scribe software for clinical note transcription with the patient, who gave verbal consent to proceed.  History of Present Illness  Headache and migraine symptoms - Significant headaches over the last year. - Triggers include recent travel, time changes, illness, and poor sleep - Headaches worsened during recent influenza B infection around New Year's - No associated nausea, vomiting, or diarrhea  Blood pressure elevation - Elevated blood pressure, particularly high during recent influenza B infection - Blood pressure was normal at headache clinic visit on January 5 - No home blood pressure monitoring in the past month - Takes labetalol  200 mg twice daily for hypertension  Respiratory symptoms during recent illness - Shortness of breath during recent influenza B infection around New Year's - Sought care at urgent care during this episode - Symptoms resolved, back to normal  Psychosocial stressors - Busy and stressful home life with three children - Perceived worsening of headaches and blood pressure due to stress - In marriage counseling - Feels emotionally mostly okay despite stress - Takes sertraline     Review of Systems See above   Past Medical History:  Diagnosis Date   Anxiety    B12 deficiency    History of chicken pox    History of kidney stones    Hypertension    dx as at age 61   Kidney stone    Migraines    Vitamin D  deficiency     Past Surgical History:  Procedure Laterality Date   EXTRACORPOREAL SHOCK WAVE LITHOTRIPSY Left 01/09/2018   Procedure: LEFT EXTRACORPOREAL SHOCK WAVE LITHOTRIPSY (ESWL);  Surgeon: Devere Lonni Righter, MD;  Location: [facility];  Service: Urology;  Laterality: Left;   WISDOM TOOTH EXTRACTION      Current Outpatient Medications  Medication Instructions   acetaminophen   (TYLENOL ) 1,000 mg, Every 6 hours PRN   ALPRAZolam  (XANAX ) 0.5 MG tablet TAKE 1/2-1 TABLET BY MOUTH 3 TIMES DAILY AS NEEDED FOR ANXIETY   Cyanocobalamin  (B-12 PO) Take by mouth.   labetalol  (NORMODYNE ) 200 mg, Oral, 2 times daily   Multiple Vitamin (MULTIVITAMIN) tablet 1 tablet, Daily   sertraline (ZOLOFT) 25 mg, Daily   SUMAtriptan  (IMITREX ) 50 MG tablet TAKE 1-2 TABS BY MOUTH AS NEEDED FOR MIGRAINE. MAY REPEAT IN 2 HOURS IF NEEDED. MAX 4 TABS/24 HOURS   SUMAtriptan  0.5 mg, Subcutaneous, Every 2 hours PRN, Max 2 doses per 24hrs   VITAMIN D  PO Take by mouth.   zonisamide (ZONEGRAN) 25 mg, Daily       Objective:   Physical Exam BP 138/80   Pulse 82   Temp 98.3 F (36.8 C) (Oral)   Resp 16   Ht 5' 6 (1.676 m)   Wt 132 lb (59.9 kg)   LMP 04/01/2024 (Exact Date)   SpO2 97%   BMI 21.31 kg/m  General:   Well developed, NAD, BMI noted. HEENT:  Normocephalic . Face symmetric, atraumatic Lungs:  CTA B Normal respiratory effort, no intercostal retractions, no accessory muscle use. Heart: RRR,  no murmur.  Lower extremities: no pretibial edema bilaterally  Skin: Not pale. Not jaundice Neurologic:  alert & oriented X3.  Speech normal, gait appropriate for age and unassisted Psych--  Cognition and judgment appear intact.  Cooperative with normal attention span and concentration.  Behavior appropriate. No anxious or depressed  appearing.      Assessment     Assessment  HTN dx age 60 Anxiety-- xanax  prn  H/o Migraines -- on imitrex   H/o urolithiasis , last 01/2018 Birth control: husband's vasectomy  Assessment & Plan Hypertension At the last visit, I increased labetalol  to 1.5 tablets twice daily, she is however taking only 1 tablet twice a day.  No routine ambulatory BPs. Went to see the neurologist last week, BP was good, BP today is 138/80.  She is active and eats healthy Plan:  Continue labetalol  200 mg 1 tablet twice daily, CMP CBC,monitor BPs. If she is sick with  the flu or something similar, and the blood pressure goes over 150 or 160, she could take an additional half labetalol  tablet. Migraine Since the last visit, she met with Dr. Oneita 10-2023, also saw him 04/09/2024 (note not available).  Recommend to continue working with him for migraine management. Anxiety: She is very busy, has 3 children, on Zoloft, encourage to seek counseling as stress management is extremely important part of her health. Influenza: Diagnosed with influenza B about 2 weeks ago, symptoms resolved. General Health Maintenance Advised about the benefit of flu and a COVID-vaccine States she had CPX with gynecology RTC 6 months     "

## 2024-04-18 NOTE — Assessment & Plan Note (Signed)
 Hypertension At the last visit, I increased labetalol  to 1.5 tablets twice daily, she is however taking only 1 tablet twice a day.  No routine ambulatory BPs. Went to see the neurologist last week, BP was good, BP today is 138/80.  She is active and eats healthy Plan:  Continue labetalol  200 mg 1 tablet twice daily, CMP CBC,monitor BPs. If she is sick with the flu or something similar, and the blood pressure goes over 150 or 160, she could take an additional half labetalol  tablet. Migraine Since the last visit, she met with Dr. Oneita 10-2023, also saw him 04/09/2024 (note not available).  Recommend to continue working with him for migraine management. Anxiety: She is very busy, has 3 children, on Zoloft, encourage to seek counseling as stress management is extremely important part of her health. Influenza: Diagnosed with influenza B about 2 weeks ago, symptoms resolved. General Health Maintenance Advised about the benefit of flu and a COVID-vaccine States she had CPX with gynecology RTC 6 months

## 2024-04-18 NOTE — Patient Instructions (Signed)
 Please read your instructions carefully.   GO TO THE LAB :  Get the blood work    Go to the front desk for the checkout Please make an appointment for a checkup in 6 months   Vaccines I recommend: Flu shot COVID-vaccine  Consider seeing a counselor to help you with the stress management   Start checking your blood pressure regularly, 2-3 times a week Blood pressure goal:  between 110/65 and  130/80. If it is consistently higher or lower, let me know

## 2024-04-19 ENCOUNTER — Ambulatory Visit: Payer: Self-pay | Admitting: Internal Medicine

## 2024-04-19 LAB — CBC WITH DIFFERENTIAL/PLATELET
Basophils Absolute: 0.1 K/uL (ref 0.0–0.1)
Basophils Relative: 1 % (ref 0.0–3.0)
Eosinophils Absolute: 0.1 K/uL (ref 0.0–0.7)
Eosinophils Relative: 1.2 % (ref 0.0–5.0)
HCT: 36.8 % (ref 36.0–46.0)
Hemoglobin: 12.4 g/dL (ref 12.0–15.0)
Lymphocytes Relative: 16.1 % (ref 12.0–46.0)
Lymphs Abs: 1.2 K/uL (ref 0.7–4.0)
MCHC: 33.5 g/dL (ref 30.0–36.0)
MCV: 91 fl (ref 78.0–100.0)
Monocytes Absolute: 0.7 K/uL (ref 0.1–1.0)
Monocytes Relative: 9.9 % (ref 3.0–12.0)
Neutro Abs: 5.3 K/uL (ref 1.4–7.7)
Neutrophils Relative %: 71.8 % (ref 43.0–77.0)
Platelets: 270 K/uL (ref 150.0–400.0)
RBC: 4.05 Mil/uL (ref 3.87–5.11)
RDW: 13.2 % (ref 11.5–15.5)
WBC: 7.3 K/uL (ref 4.0–10.5)

## 2024-04-19 LAB — COMPREHENSIVE METABOLIC PANEL WITH GFR
ALT: 11 U/L (ref 3–35)
AST: 14 U/L (ref 5–37)
Albumin: 4 g/dL (ref 3.5–5.2)
Alkaline Phosphatase: 46 U/L (ref 39–117)
BUN: 11 mg/dL (ref 6–23)
CO2: 27 meq/L (ref 19–32)
Calcium: 8.9 mg/dL (ref 8.4–10.5)
Chloride: 105 meq/L (ref 96–112)
Creatinine, Ser: 1 mg/dL (ref 0.40–1.20)
GFR: 68.85 mL/min
Glucose, Bld: 91 mg/dL (ref 70–99)
Potassium: 3.7 meq/L (ref 3.5–5.1)
Sodium: 137 meq/L (ref 135–145)
Total Bilirubin: 0.4 mg/dL (ref 0.2–1.2)
Total Protein: 6.3 g/dL (ref 6.0–8.3)

## 2024-04-21 LAB — DRUG MONITORING PANEL 375977 , URINE
Alcohol Metabolites: NEGATIVE ng/mL
Alphahydroxyalprazolam: 113 ng/mL — ABNORMAL HIGH
Alphahydroxymidazolam: NEGATIVE ng/mL
Alphahydroxytriazolam: NEGATIVE ng/mL
Aminoclonazepam: NEGATIVE ng/mL
Amphetamines: NEGATIVE ng/mL
Barbiturates: NEGATIVE ng/mL
Benzodiazepines: POSITIVE ng/mL — AB
Cocaine Metabolite: NEGATIVE ng/mL
Desmethyltramadol: NEGATIVE ng/mL
Hydroxyethylflurazepam: NEGATIVE ng/mL
Lorazepam: NEGATIVE ng/mL
Marijuana Metabolite: NEGATIVE ng/mL
Nordiazepam: NEGATIVE ng/mL
Opiates: NEGATIVE ng/mL
Oxazepam: NEGATIVE ng/mL
Oxycodone: NEGATIVE ng/mL
Temazepam: NEGATIVE ng/mL
Tramadol: NEGATIVE ng/mL

## 2024-04-21 LAB — DM TEMPLATE

## 2024-10-16 ENCOUNTER — Ambulatory Visit: Admitting: Internal Medicine
